# Patient Record
Sex: Female | Born: 2000 | Race: Black or African American | Hispanic: No | Marital: Single | Smoking: Never smoker
Health system: Southern US, Community
[De-identification: ages and names within clinical notes are randomized; demographics above are authoritative.]

## PROBLEM LIST (undated history)

## (undated) DIAGNOSIS — F32A Depression, unspecified: Secondary | ICD-10-CM

## (undated) DIAGNOSIS — J45909 Unspecified asthma, uncomplicated: Secondary | ICD-10-CM

## (undated) HISTORY — DX: Depression, unspecified: F32.A

## (undated) HISTORY — DX: Unspecified asthma, uncomplicated: J45.909

---

## 2020-09-15 ENCOUNTER — Ambulatory Visit (INDEPENDENT_AMBULATORY_CARE_PROVIDER_SITE_OTHER): Payer: Self-pay

## 2020-09-15 ENCOUNTER — Encounter (HOSPITAL_COMMUNITY): Payer: Self-pay

## 2020-09-15 ENCOUNTER — Ambulatory Visit (HOSPITAL_COMMUNITY)
Admission: RE | Admit: 2020-09-15 | Discharge: 2020-09-15 | Disposition: A | Discharge status: Home / Self Care | Payer: Self-pay | Source: Ambulatory Visit | Attending: Family Medicine | Admitting: Family Medicine

## 2020-09-15 ENCOUNTER — Other Ambulatory Visit: Payer: Self-pay

## 2020-09-15 VITALS — BP 129/86 | HR 74 | Temp 98.8°F

## 2020-09-15 DIAGNOSIS — R059 Cough, unspecified: Secondary | ICD-10-CM

## 2020-09-15 DIAGNOSIS — R0602 Shortness of breath: Secondary | ICD-10-CM

## 2020-09-15 DIAGNOSIS — J0100 Acute maxillary sinusitis, unspecified: Secondary | ICD-10-CM

## 2020-09-15 DIAGNOSIS — J209 Acute bronchitis, unspecified: Secondary | ICD-10-CM

## 2020-09-15 MED ORDER — ALBUTEROL SULFATE HFA 108 (90 BASE) MCG/ACT IN AERS
INHALATION_SPRAY | RESPIRATORY_TRACT | Status: AC
  Filled 2020-09-15: qty 6.7

## 2020-09-15 MED ORDER — PREDNISONE 10 MG (21) PO TBPK
ORAL_TABLET | Freq: Every day | ORAL | 0 refills | Status: AC
Start: 2020-09-15 — End: 2020-09-21

## 2020-09-15 MED ORDER — ALBUTEROL SULFATE HFA 108 (90 BASE) MCG/ACT IN AERS
2.0000 | INHALATION_SPRAY | Freq: Once | RESPIRATORY_TRACT | Status: AC
  Administered 2020-09-15: 2 via RESPIRATORY_TRACT

## 2020-09-15 MED ORDER — METHYLPREDNISOLONE SODIUM SUCC 125 MG IJ SOLR
125.0000 mg | Freq: Once | INTRAMUSCULAR | Status: AC
  Administered 2020-09-15: 125 mg via INTRAMUSCULAR

## 2020-09-15 MED ORDER — METHYLPREDNISOLONE SODIUM SUCC 125 MG IJ SOLR
INTRAMUSCULAR | Status: AC
  Filled 2020-09-15: qty 2

## 2020-09-15 MED ORDER — AMOXICILLIN-POT CLAVULANATE 875-125 MG PO TABS
1.0000 | ORAL_TABLET | Freq: Two times a day (BID) | ORAL | 0 refills | Status: AC
Start: 2020-09-15 — End: 2020-09-22

## 2020-09-15 NOTE — ED Provider Notes (Signed)
[facility]   [id number] 09/15/20 Arrival Time: 1147   CC: COVID symptoms  SUBJECTIVE: History from: patient.  Stacy Hayes is a 20 y.o. female who presents with nasal congestion, cough, wheezing, SOB, chest tightness and pain with coughing x 2 weeks . Denies sick exposure to COVID, flu or strep. Denies recent travel. Has negative history of Covid. Has not completed Covid vaccines. Has not taken OTC medications for this. Symptoms are worsened with activity. Denies previous symptoms in the past. Denies fever, chills,  nausea, changes in bowel or bladder habits.    ROS: As per HPI.  All other pertinent ROS negative.     History reviewed. No pertinent past medical history. History reviewed. No pertinent surgical history. No Known Allergies No current facility-administered medications on file prior to encounter.   No current outpatient medications on file prior to encounter.   Social History   Socioeconomic History  . Marital status: Single    Spouse name: Not on file  . Number of children: Not on file  . Years of education: Not on file  . Highest education level: Not on file  Occupational History  . Not on file  Tobacco Use  . Smoking status: Never Smoker  . Smokeless tobacco: Never Used  Substance and Sexual Activity  . Alcohol use: Not on file  . Drug use: Not on file  . Sexual activity: Not on file  Other Topics Concern  . Not on file  Social History Narrative  . Not on file   Social Determinants of Health   Financial Resource Strain: Not on file  Food Insecurity: Not on file  Transportation Needs: Not on file  Physical Activity: Not on file  Stress: Not on file  Social Connections: Not on file  Intimate Partner Violence: Not on file   History reviewed. No pertinent family history.  OBJECTIVE:  Vitals:   09/15/20 1213  BP: 129/86  Pulse: 74  Temp: 98.8 F (37.1 C)  TempSrc: Oral  SpO2: 96%     General appearance: alert; appears  fatigued, but nontoxic; speaking in full sentences and tolerating own secretions HEENT: NCAT; Ears: EACs clear, TMs pearly gray; Eyes: PERRL.  EOM grossly intact. Sinuses: maxillary sinus tenderness; Nose: nares patent with clear rhinorrhea, Throat: oropharynx erythematous, cobblestoning present, tonsils non erythematous or enlarged, uvula midline  Neck: supple with LAD Lungs: unlabored respirations, symmetrical air entry; cough: moderate; no respiratory distress; rhonchi noted to bilateral upper lobes with diffuse wheezing throughout  Heart: regular rate and rhythm.  Radial pulses 2+ symmetrical bilaterally Skin: warm and dry Psychological: alert and cooperative; normal mood and affect  LABS:  No results found for this or any previous visit (from the past 24 hour(s)).   ASSESSMENT & PLAN:  1. Acute non-recurrent maxillary sinusitis   2. Acute bronchitis, unspecified organism   3. SOB (shortness of breath)   4. Cough     Meds ordered this encounter  Medications  . methylPREDNISolone sodium succinate (SOLU-MEDROL) 125 mg/2 mL injection 125 mg  . albuterol (VENTOLIN HFA) 108 (90 Base) MCG/ACT inhaler 2 puff  . amoxicillin-clavulanate (AUGMENTIN) 875-125 MG tablet    Sig: Take 1 tablet by mouth 2 (two) times daily for 7 days.    Dispense:  14 tablet    Refill:  0    Order Specific Question:   Supervising Provider    Answer:   Merrilee Jansky [id number]  . predniSONE (STERAPRED UNI-PAK 21 TAB) 10 MG (21) TBPK tablet  Sig: Take by mouth daily for 6 days. Take 6 tablets on day 1, 5 tablets on day 2, 4 tablets on day 3, 3 tablets on day 4, 2 tablets on day 5, 1 tablet on day 6    Dispense:  21 tablet    Refill:  0    Order Specific Question:   Supervising Provider    Answer:   Merrilee Jansky [[id number]]   Chest xray negative for pneumonia today Solumedrol 125mg  IM in office today Albuterol inhaler 2 puffs in office today Prescribed steroid taper Prescribed Augmentin BID x 7 days  for likely sinusitis Continue supportive care at home Get plenty of rest and push fluids Use OTC zyrtec for nasal congestion, runny nose, and/or sore throat Use OTC flonase for nasal congestion and runny nose Use medications daily for symptom relief Use OTC medications like ibuprofen or tylenol as needed fever or pain Call or go to the ED if you have any new or worsening symptoms such as fever, worsening cough, shortness of breath, chest tightness, chest pain, turning blue, changes in mental status.  Reviewed expectations re: course of current medical issues. Questions answered. Outlined signs and symptoms indicating need for more acute intervention. Patient verbalized understanding. After Visit Summary given.         , NP 09/15/20 1257

## 2020-09-15 NOTE — Discharge Instructions (Addendum)
Your chest xray is negative for pneumonia  You have received a steroid injection to help open you up and breathe a little better  You have received an albuterol inhaler in the office today. You may use 2 puffs every 4 hours as needed for cough, shortness of breath, wheezing.  I have sent in Augmentin for you to take twice a day for 7 days.  I have sent in a prednisone taper for you to take for 6 days. 6 tablets on day one, 5 tablets on day two, 4 tablets on day three, 3 tablets on day four, 2 tablets on day five, and 1 tablet on day six.  Follow up with this office or with primary care if symptoms are persisting.  Follow up in the ER for high fever, trouble swallowing, trouble breathing, other concerning symptoms.

## 2020-09-15 NOTE — ED Triage Notes (Signed)
Pt presents with SOB x 2 weeks. Pt states she has been noticing blood in her mucus.

## 2020-10-17 ENCOUNTER — Encounter (HOSPITAL_COMMUNITY): Payer: Self-pay

## 2020-10-17 ENCOUNTER — Ambulatory Visit (HOSPITAL_COMMUNITY)
Admission: RE | Admit: 2020-10-17 | Discharge: 2020-10-17 | Disposition: A | Discharge status: Home / Self Care | Payer: Self-pay | Source: Ambulatory Visit | Attending: Urgent Care | Admitting: Urgent Care

## 2020-10-17 ENCOUNTER — Other Ambulatory Visit: Payer: Self-pay

## 2020-10-17 VITALS — BP 138/90 | HR 78 | Temp 100.1°F | Resp 17

## 2020-10-17 DIAGNOSIS — R590 Localized enlarged lymph nodes: Secondary | ICD-10-CM | POA: Insufficient documentation

## 2020-10-17 DIAGNOSIS — L739 Follicular disorder, unspecified: Secondary | ICD-10-CM | POA: Insufficient documentation

## 2020-10-17 LAB — COMPREHENSIVE METABOLIC PANEL
ALT: 15 U/L (ref 0–44)
AST: 21 U/L (ref 15–41)
Albumin: 4.4 g/dL (ref 3.5–5.0)
Alkaline Phosphatase: 53 U/L (ref 38–126)
Anion gap: 8 (ref 5–15)
BUN: 5 mg/dL — ABNORMAL LOW (ref 6–20)
CO2: 25 mmol/L (ref 22–32)
Calcium: 9.8 mg/dL (ref 8.9–10.3)
Chloride: 105 mmol/L (ref 98–111)
Creatinine, Ser: 0.92 mg/dL (ref 0.44–1.00)
GFR, Estimated: >60 mL/min (ref >60)
Glucose, Bld: 87 mg/dL (ref 70–99)
Potassium: 3.9 mmol/L (ref 3.5–5.1)
Sodium: 138 mmol/L (ref 135–145)
Total Bilirubin: 0.8 mg/dL (ref 0.3–1.2)
Total Protein: 7.7 g/dL (ref 6.5–8.1)

## 2020-10-17 LAB — CBC WITH DIFFERENTIAL/PLATELET
Abs Immature Granulocytes: 0.01 10*3/uL (ref 0.00–0.07)
Basophils Absolute: 0 10*3/uL (ref 0.0–0.1)
Basophils Relative: 1 %
Eosinophils Absolute: 0.4 10*3/uL (ref 0.0–0.5)
Eosinophils Relative: 7 %
HCT: 46.6 % — ABNORMAL HIGH (ref 36.0–46.0)
Hemoglobin: 15.6 g/dL — ABNORMAL HIGH (ref 12.0–15.0)
Immature Granulocytes: 0 %
Lymphocytes Relative: 31 %
Lymphs Abs: 1.5 10*3/uL (ref 0.7–4.0)
MCH: 30.7 pg (ref 26.0–34.0)
MCHC: 33.5 g/dL (ref 30.0–36.0)
MCV: 91.7 fL (ref 80.0–100.0)
Monocytes Absolute: 0.4 10*3/uL (ref 0.1–1.0)
Monocytes Relative: 9 %
Neutro Abs: 2.6 10*3/uL (ref 1.7–7.7)
Neutrophils Relative %: 52 %
Platelets: 365 10*3/uL (ref 150–400)
RBC: 5.08 MIL/uL (ref 3.87–5.11)
RDW: 11.9 % (ref 11.5–15.5)
WBC: 5 10*3/uL (ref 4.0–10.5)
nRBC: 0 % (ref 0.0–0.2)

## 2020-10-17 LAB — HIV ANTIBODY (ROUTINE TESTING W REFLEX): HIV Screen 4th Generation wRfx: NONREACTIVE

## 2020-10-17 MED ORDER — NAPROXEN 500 MG PO TABS: 500.0000 mg | ORAL_TABLET | Freq: Two times a day (BID) | ORAL | 0 refills | Status: DC

## 2020-10-17 MED ORDER — DOXYCYCLINE HYCLATE 100 MG PO CAPS: 100.0000 mg | ORAL_CAPSULE | Freq: Two times a day (BID) | ORAL | 0 refills | Status: DC

## 2020-10-17 NOTE — ED Triage Notes (Signed)
Pt presents with unknown sores in scalp that is causing swelling behind her ear for past few days; pt states she had steriods and antibiotics at last visit and did not finish them.

## 2020-10-17 NOTE — ED Provider Notes (Signed)
[facility]   MRN: [id number] DOB: 2000-12-16  Subjective:   Stacy Hayes is a 20 y.o. female presenting for 3 to 4 days of acute onset painful sores over her scalp area.  Has also started to notice that she has had more swollen lymph nodes along the sides of her neck.  States that on the left side she is persistently had this since she was a child but has never had it worked up.  Denies chest pain, shortness of breath, nausea, vomiting, abdominal pain.  To the best of her knowledge, patient is otherwise healthy.  No current facility-administered medications for this encounter. No current outpatient medications on file.   No Known Allergies  History reviewed. No pertinent past medical history.   History reviewed. No pertinent surgical history.  Family History  Family history unknown: Yes    Social History   Tobacco Use  . Smoking status: Never Smoker  . Smokeless tobacco: Never Used    ROS   Objective:   Vitals: BP 138/90 (BP Location: Left Arm)   Pulse 78   Temp 100.1 F (37.8 C) (Oral)   Resp 17   LMP 09/25/2020   SpO2 98%   Physical Exam Constitutional:      General: She is not in acute distress.    Appearance: Normal appearance. She is well-developed. She is obese. She is not ill-appearing, toxic-appearing or diaphoretic.  HENT:     Head: Normocephalic and atraumatic.      Right Ear: External ear normal.     Left Ear: External ear normal.     Nose: Nose normal.     Mouth/Throat:     Mouth: Mucous membranes are moist.     Pharynx: Oropharynx is clear.  Eyes:     General: No scleral icterus.       Right eye: No discharge.        Left eye: No discharge.     Extraocular Movements: Extraocular movements intact.     Conjunctiva/sclera: Conjunctivae normal.     Pupils: Pupils are equal, round, and reactive to light.  Cardiovascular:     Rate and Rhythm: Normal rate.  Pulmonary:     Effort: Pulmonary effort is normal.  Chest:   Breasts:     Right: No axillary adenopathy or supraclavicular adenopathy.     Left: No axillary adenopathy or supraclavicular adenopathy.    Lymphadenopathy:     Head:     Right side of head: No submental, submandibular, tonsillar, preauricular, posterior auricular or occipital adenopathy.     Left side of head: No submental, submandibular, tonsillar, preauricular, posterior auricular or occipital adenopathy.     Cervical: Cervical adenopathy present.     Right cervical: Superficial cervical adenopathy present.     Left cervical: Superficial cervical adenopathy present.     Upper Body:     Right upper body: No supraclavicular, axillary, pectoral or epitrochlear adenopathy.     Left upper body: No supraclavicular, axillary, pectoral or epitrochlear adenopathy.     Lower Body: No right inguinal adenopathy. No left inguinal adenopathy.  Skin:    General: Skin is warm and dry.  Neurological:     General: No focal deficit present.     Mental Status: She is alert and oriented to person, place, and time.  Psychiatric:        Mood and Affect: Mood normal.        Behavior: Behavior normal.  Thought Content: Thought content normal.        Judgment: Judgment normal.     Assessment and Plan :   PDMP not reviewed this encounter.  1. Folliculitis   2. Cervical lymphadenopathy     Will manage for folliculitis with doxycycline especially in light of her pain, fever.  Use naproxen for pain, inflammation and cervical lymphadenopathy, labs pending.  Emphasized need to establish care with a PCP for close follow-up. Counseled patient on potential for adverse effects with medications prescribed/recommended today, ER and return-to-clinic precautions discussed, patient verbalized understanding.    Wallis Bamberg, PA-C 10/17/20 1552

## 2020-10-18 LAB — RPR: RPR Ser Ql: NONREACTIVE

## 2020-10-26 ENCOUNTER — Encounter: Payer: Self-pay | Admitting: *Deleted

## 2020-10-30 ENCOUNTER — Telehealth: Payer: Self-pay

## 2020-10-30 NOTE — Telephone Encounter (Signed)
Left message to call back and set new patient appt

## 2020-12-02 ENCOUNTER — Ambulatory Visit: Payer: Self-pay | Admitting: *Deleted

## 2020-12-02 NOTE — Telephone Encounter (Signed)
  Patient c/o multiple areas on body with swollen lymph nodes. Patient went to UC due to swollen lymph nodes , sore throat, fatigue and lymph nodes getting bigger over time. Noted swelling of lymph nodes greater than 10 days ago . Reports slow growing. Behind neck - marble size, behind left ear- pea size, under right and left armpits. Right greater than left , no pain but discomfort, denies redness . Noted swelling behind left ear in April 16 . C/o fever temp 100 range. Sore throat,and noted tonsil look swollen. Mild shortness of breath at times. Denies chest pain , difficulty breathing or swallowing. No pulsating from swollen areas. Patient called to establish with PCP per UC request. Care advise given. Patient verbalized understanding if symptoms worsen call back or go to [facility] or ED.   Reason for Disposition . [1] Mildly swollen lymph node AND [2] has cold symptoms (e.g., runny nose, cough, sore throat)  Answer Assessment - Initial Assessment Questions 1. LOCATION: "Where is the swollen node located?" "Is the matching node on the other side of the body also swollen?"      Multiple areas. Behind neck , behind left ear, under right and left armpits.  2. SIZE: "How big is the node?" (e.g., inches or centimeters; or compared to common objects such as pea, bean, marble, golf ball)      Neck - marble size, behind left ear pea size 3. ONSET: "When did the swelling start?"      Greater than 10 days ago. Slow growing  4. NECK NODES: "Is there a sore throat, runny nose or other symptoms of a cold?"      Sore throat, tonsils swollen 5. GROIN OR ARMPIT NODES: "Is there a sore, scratch, cut or painful red area on that arm or leg?"      No pain, discomfort no redness.  6. FEVER: "Do you have a fever?" If Yes, ask: "What is it, how was it measured, and when did it start?"      100 7. CAUSE: "What do you think is causing the swollen lymph nodes?"     Not sure  8. OTHER SYMPTOMS: "Do you have any other  symptoms?"     Fatigue with mild shortness of breath 9. PREGNANCY: "Is there any chance you are pregnant?" "When was your last menstrual period?"     na  Protocols used: LYMPH NODES - [facility]

## 2020-12-07 ENCOUNTER — Ambulatory Visit: Payer: Self-pay | Admitting: Family Medicine

## 2020-12-09 ENCOUNTER — Ambulatory Visit: Payer: Self-pay | Admitting: Family Medicine

## 2021-01-13 ENCOUNTER — Ambulatory Visit
Admission: RE | Admit: 2021-01-13 | Discharge: 2021-01-13 | Disposition: A | Discharge status: Home / Self Care | Payer: Self-pay | Source: Ambulatory Visit | Attending: Emergency Medicine | Admitting: Emergency Medicine

## 2021-01-13 ENCOUNTER — Other Ambulatory Visit: Payer: Self-pay

## 2021-01-13 VITALS — BP 124/78 | HR 94 | Temp 98.3°F | Resp 18

## 2021-01-13 DIAGNOSIS — J039 Acute tonsillitis, unspecified: Secondary | ICD-10-CM

## 2021-01-13 DIAGNOSIS — R1310 Dysphagia, unspecified: Secondary | ICD-10-CM

## 2021-01-13 DIAGNOSIS — J020 Streptococcal pharyngitis: Secondary | ICD-10-CM

## 2021-01-13 DIAGNOSIS — R591 Generalized enlarged lymph nodes: Secondary | ICD-10-CM

## 2021-01-13 LAB — POCT RAPID STREP A (OFFICE): Rapid Strep A Screen: POSITIVE — AB

## 2021-01-13 MED ORDER — IBUPROFEN 800 MG PO TABS: 800.0000 mg | ORAL_TABLET | Freq: Three times a day (TID) | ORAL | 0 refills | Status: DC

## 2021-01-13 MED ORDER — LIDOCAINE VISCOUS HCL 2 % MT SOLN: 15.0000 mL | OROMUCOSAL | 0 refills | Status: DC | PRN

## 2021-01-13 MED ORDER — AMOXICILLIN 500 MG PO CAPS: 500.0000 mg | ORAL_CAPSULE | Freq: Two times a day (BID) | ORAL | 0 refills | Status: DC

## 2021-01-13 MED ORDER — FAMOTIDINE 20 MG PO TABS: 20.0000 mg | ORAL_TABLET | Freq: Two times a day (BID) | ORAL | 0 refills | Status: DC

## 2021-01-13 NOTE — ED Provider Notes (Signed)
[facility]    CSN: [id number] Arrival date & time: 01/13/21  1154      History   Chief Complaint Chief Complaint  Patient presents with   Neck Pain    HPI Zaire Hamlett is a 20 y.o. female presenting today for evaluation of multiple symptoms-swollen lymph nodes, folliculitis, sore throat and rash.  Reports that over the past 3 months she has noticed prominent nodes to her neck and head.  Reports recently had become more painful and swollen.  She also expresses concern over possible thyroid nodules which has been felt by somebody in the past.  She has never had her thyroid evaluated.  She reports in the past few days she has developed increased sore throat and swelling in throat as well as sensation of lump lower in her throat with swallowing as well as increased discomfort in this area with deep inspiration.  Has felt subjective fevers.  HPI  History reviewed. No pertinent past medical history.  There are no problems to display for this patient.   History reviewed. No pertinent surgical history.  OB History   No obstetric history on file.      Home Medications    Prior to Admission medications   Medication Sig Start Date End Date Taking? Authorizing Provider  amoxicillin (AMOXIL) 500 MG capsule Take 1 capsule (500 mg total) by mouth 2 (two) times daily for 10 days. 01/13/21 01/23/21 Yes Yanett Conkright C, PA-C  famotidine (PEPCID) 20 MG tablet Take 1 tablet (20 mg total) by mouth 2 (two) times daily. 01/13/21  Yes Hazle Ogburn C, PA-C  ibuprofen (ADVIL) 800 MG tablet Take 1 tablet (800 mg total) by mouth 3 (three) times daily. 01/13/21  Yes Favor Kreh C, PA-C  lidocaine (XYLOCAINE) 2 % solution Use as directed 15 mLs in the mouth or throat every 4 (four) hours as needed for mouth pain. 01/13/21  Yes Mikeisha Lemonds, Junius Creamer, PA-C    Family History Family History  Problem Relation Age of Onset   Hypothyroidism Mother     Social History Social History    Tobacco Use   Smoking status: Never   Smokeless tobacco: Never  Substance Use Topics   Alcohol use: Yes    Comment: socially   Drug use: Never     Allergies   Patient has no known allergies.   Review of Systems Review of Systems  Constitutional:  Positive for fever. Negative for activity change, appetite change, chills and fatigue.  HENT:  Positive for sore throat. Negative for congestion, ear pain, rhinorrhea, sinus pressure and trouble swallowing.   Eyes:  Negative for discharge and redness.  Respiratory:  Negative for cough, chest tightness and shortness of breath.   Cardiovascular:  Negative for chest pain.  Gastrointestinal:  Negative for abdominal pain, diarrhea, nausea and vomiting.  Musculoskeletal:  Negative for myalgias.  Skin:  Positive for rash.  Neurological:  Negative for dizziness, light-headedness and headaches.  Hematological:  Positive for adenopathy.    Physical Exam Triage Vital Signs ED Triage Vitals  Enc Vitals Group     BP      Pulse      Resp      Temp      Temp src      SpO2      Weight      Height      Head Circumference      Peak Flow      Pain Score  Pain Loc      Pain Edu?      Excl. in GC?    No data found.  Updated Vital Signs BP 124/78 (BP Location: Left Arm)   Pulse 94   Temp 98.3 F (36.8 C) (Oral)   Resp 18   LMP 12/25/2020   SpO2 97%   Visual Acuity Right Eye Distance:   Left Eye Distance:   Bilateral Distance:    Right Eye Near:   Left Eye Near:    Bilateral Near:     Physical Exam Vitals and nursing note reviewed.  Constitutional:      Appearance: She is well-developed.     Comments: No acute distress  HENT:     Head: Normocephalic and atraumatic.     Ears:     Comments: Bilateral ears without tenderness to palpation of external auricle, tragus and mastoid, EAC's without erythema or swelling, TM's with good bony landmarks and cone of light. Non erythematous.      Nose: Nose normal.      Mouth/Throat:     Comments: Bilateral tonsils swollen erythematous with mild exudate bilaterally, no soft palate swelling Eyes:     Conjunctiva/sclera: Conjunctivae normal.  Cardiovascular:     Rate and Rhythm: Normal rate.  Pulmonary:     Effort: Pulmonary effort is normal. No respiratory distress.     Comments: Breathing comfortably at rest, CTABL, no wheezing, rales or other adventitious sounds auscultated  Abdominal:     General: There is no distension.  Musculoskeletal:        General: Normal range of motion.     Cervical back: Neck supple.  Skin:    General: Skin is warm and dry.  Neurological:     Mental Status: She is alert and oriented to person, place, and time.     UC Treatments / Results  Labs (all labs ordered are listed, but only abnormal results are displayed) Labs Reviewed  POCT RAPID STREP A (OFFICE) - Abnormal; Notable for the following components:      Result Value   Rapid Strep A Screen Positive (*)    All other components within normal limits  CBC WITH DIFFERENTIAL/PLATELET  BASIC METABOLIC PANEL  TSH  EBV AB TO VIRAL CAPSID AG PNL, IGG+IGM    EKG   Radiology No results found.  Procedures Procedures (including critical care time)  Medications Ordered in UC Medications - No data to display  Initial Impression / Assessment and Plan / UC Course  I have reviewed the triage vital signs and the nursing notes.  Pertinent labs & imaging results that were available during my care of the patient were reviewed by me and considered in my medical decision making (see chart for details).     Strep test positive-treating for strep with amoxicillin x10 days, symptomatic and supportive care as well, may be contributing to increased lymphadenopathy recently. Lymphadenopathy-symptoms x3 months, screening for mono, blood work pending as well as CBC and BMP, encouraged follow-up with primary care for further evaluation Thyroid nodules-TSH pending, encouraged  follow-up with primary care as well for further evaluation of thyroid. Dysphagia-sensation of lump in throat, possible correlation underlying acid and recommended Pepcid, also recommended further follow-up with primary care if symptoms persisting despite treatment with antacids.  Discussed strict return precautions. Patient verbalized understanding and is agreeable with plan.  Final Clinical Impressions(s) / UC Diagnoses   Final diagnoses:  Lymphadenopathy  Acute tonsillitis, unspecified etiology  Dysphagia, unspecified type  Streptococcal sore throat  Discharge Instructions      Strep test positive-begin amoxicillin twice daily for 10 days, please complete full course Tylenol and ibuprofen for pain and swelling Salt water gargles May use viscous lidocaine up to 3-4 times daily for discomfort Trial of Pepcid twice daily to help with sensation of difficulty swallowing Blood work pending to further evaluate thyroid, screen for mono and check basic blood counts, I will call if abnormal Please contact primary care's to set up follow-up appointment for further evaluation of your thyroid/symptoms.     ED Prescriptions     Medication Sig Dispense Auth. Provider   amoxicillin (AMOXIL) 500 MG capsule Take 1 capsule (500 mg total) by mouth 2 (two) times daily for 10 days. 20 capsule Calven Gilkes C, PA-C   ibuprofen (ADVIL) 800 MG tablet Take 1 tablet (800 mg total) by mouth 3 (three) times daily. 21 tablet Zarin Hagmann C, PA-C   famotidine (PEPCID) 20 MG tablet Take 1 tablet (20 mg total) by mouth 2 (two) times daily. 30 tablet Nakyia Dau C, PA-C   lidocaine (XYLOCAINE) 2 % solution Use as directed 15 mLs in the mouth or throat every 4 (four) hours as needed for mouth pain. 200 mL Gauge Winski, [place] C, PA-C      PDMP not reviewed this encounter.   Lew Dawes, PA-C 01/13/21 1408

## 2021-01-13 NOTE — Discharge Instructions (Addendum)
Strep test positive-begin amoxicillin twice daily for 10 days, please complete full course Tylenol and ibuprofen for pain and swelling Salt water gargles May use viscous lidocaine up to 3-4 times daily for discomfort Trial of Pepcid twice daily to help with sensation of difficulty swallowing Blood work pending to further evaluate thyroid, screen for mono and check basic blood counts, I will call if abnormal Please contact primary care's to set up follow-up appointment for further evaluation of your thyroid/symptoms.

## 2021-01-13 NOTE — ED Triage Notes (Signed)
Patient presents to [facility] for evaluation of neck pain, lymph swelling, "thryoid nodules".  PAtient states she has had some voice hoarseness intermittently for the past few months as well.  Starting 3 days ago she began to have sore throat and pain with swallowing.  Also concerned for folliculitis of her scalp, treated with doxycycline in April of 2022

## 2021-01-14 ENCOUNTER — Other Ambulatory Visit: Payer: Self-pay

## 2021-01-14 ENCOUNTER — Emergency Department (HOSPITAL_BASED_OUTPATIENT_CLINIC_OR_DEPARTMENT_OTHER)
Admission: EM | Admit: 2021-01-14 | Discharge: 2021-01-14 | Disposition: A | Discharge status: Home / Self Care | Payer: Self-pay | Attending: Emergency Medicine | Admitting: Emergency Medicine

## 2021-01-14 ENCOUNTER — Encounter (HOSPITAL_BASED_OUTPATIENT_CLINIC_OR_DEPARTMENT_OTHER): Payer: Self-pay | Admitting: *Deleted

## 2021-01-14 ENCOUNTER — Ambulatory Visit: Payer: Self-pay | Admitting: *Deleted

## 2021-01-14 DIAGNOSIS — J020 Streptococcal pharyngitis: Secondary | ICD-10-CM | POA: Insufficient documentation

## 2021-01-14 MED ORDER — ONDANSETRON 4 MG PO TBDP: 4.0000 mg | ORAL_TABLET | Freq: Three times a day (TID) | ORAL | 0 refills | Status: DC | PRN

## 2021-01-14 MED ORDER — AMOXICILLIN 400 MG/5ML PO SUSR: 500.0000 mg | Freq: Two times a day (BID) | ORAL | 0 refills | Status: AC

## 2021-01-14 NOTE — Telephone Encounter (Signed)
Patient reports she has had swollen lymph nodes and thyroid enlargement. Not able to eat and drink for 3 days- painful swallowing. Patient was seen at [facility]- yesterday- patient was treated for antiacid, pain relief, antibiotic. Reason for Disposition  SEVERE symptoms of pill stuck in throat or esophagus (e.g., severe pain, bleeding, or inability to swallow liquids)  Answer Assessment - Initial Assessment Questions 1. SYMPTOM: "Are you having difficulty swallowing liquids, solids, or both?"     Difficulty swallowing 2. ONSET: "When did the swallowing problems begin?"      March - getting worse 3. CAUSE: "What do you think is causing the problem?"      Thyroid nodules getting larger 4. CHRONIC/RECURRENT: "Is this a new problem for you?"  If no, ask: "How long have you had this problem?" (e.g., days, weeks, months)      Worsening symptoms 5. OTHER SYMPTOMS: "Do you have any other symptoms?" (e.g., difficulty breathing, sore throat, swollen tongue, chest pain)     Painful swallowing  Protocols used: Swallowing Difficulty-A-AH

## 2021-01-14 NOTE — ED Triage Notes (Signed)
Pt reports + for strep yesterday at Urgent Care. Unable to take antibiotics due to pain with swallowing. Also reports hx of thyroid nodules

## 2021-01-14 NOTE — ED Provider Notes (Signed)
[facility] EMERGENCY DEPT Provider Note   CSN: [id number] Arrival date & time: 01/14/21  1231     History Chief Complaint  Patient presents with   Sore Throat    Stacy Hayes is a 20 y.o. female.  HPI 20 year old female presents with sore throat and difficulty swallowing.  She has been having some issues for several months and has been told by her nurse practitioner friend that she has thyroid nodules.  She is also had some swollen lymph nodes.  However over the last 3 days she is now having pain when swallowing and breathing in her throat.  No fevers.  She is having some new lymph node swelling.  Went to urgent care yesterday where she tested positive for strep and was given a prescription of amoxicillin, Pepcid, ibuprofen and lidocaine solution.  She has not taken any of these.  She did take 2 doses of amoxicillin that she had leftover when this first started but it did not seem to help so she was discouraged by this.  Sometimes when she swallows she will vomit back up.  Otherwise liquids and food are able to be swallowed, she is very painful.  History reviewed. No pertinent past medical history.  There are no problems to display for this patient.   History reviewed. No pertinent surgical history.   OB History   No obstetric history on file.     Family History  Problem Relation Age of Onset   Hypothyroidism Mother     Social History   Tobacco Use   Smoking status: Never   Smokeless tobacco: Never  Vaping Use   Vaping Use: Every day  Substance Use Topics   Alcohol use: Yes    Comment: socially   Drug use: Never    Home Medications Prior to Admission medications   Medication Sig Start Date End Date Taking? Authorizing Provider  amoxicillin (AMOXIL) 400 MG/5ML suspension Take 6.3 mLs (500 mg total) by mouth 2 (two) times daily for 10 days. 01/14/21 01/24/21 Yes Pricilla Loveless, MD  ondansetron (ZOFRAN ODT) 4 MG disintegrating tablet Take 1 tablet  (4 mg total) by mouth every 8 (eight) hours as needed for nausea or vomiting. 01/14/21  Yes Pricilla Loveless, MD  famotidine (PEPCID) 20 MG tablet Take 1 tablet (20 mg total) by mouth 2 (two) times daily. 01/13/21   Wieters, Hallie C, PA-C  ibuprofen (ADVIL) 800 MG tablet Take 1 tablet (800 mg total) by mouth 3 (three) times daily. 01/13/21   Wieters, Hallie C, PA-C  lidocaine (XYLOCAINE) 2 % solution Use as directed 15 mLs in the mouth or throat every 4 (four) hours as needed for mouth pain. 01/13/21   Wieters, Hallie C, PA-C    Allergies    Patient has no known allergies.  Review of Systems   Review of Systems  Constitutional:  Negative for fever.  HENT:  Positive for sore throat and trouble swallowing. Negative for voice change.   Respiratory:  Negative for cough and shortness of breath.   Gastrointestinal:  Positive for vomiting.   Physical Exam Updated Vital Signs BP 126/85 (BP Location: Left Arm)   Pulse 100   Temp 98.2 F (36.8 C) (Oral)   Resp 16   Ht 5\' 6"  (1.676 m)   Wt 94 kg   LMP 12/25/2020   SpO2 97%   BMI 33.45 kg/m   Physical Exam Vitals and nursing note reviewed.  Constitutional:      General: She is not in acute  distress.    Appearance: She is well-developed. She is not ill-appearing or diaphoretic.  HENT:     Head: Normocephalic and atraumatic.     Right Ear: External ear normal.     Left Ear: External ear normal.     Nose: Nose normal.     Mouth/Throat:     Pharynx: Uvula midline. Posterior oropharyngeal erythema present. No uvula swelling.     Tonsils: Tonsillar exudate present. No tonsillar abscesses. 2+ on the right. 2+ on the left.     Comments: Normal phonation Eyes:     General:        Right eye: No discharge.        Left eye: No discharge.  Cardiovascular:     Rate and Rhythm: Normal rate and regular rhythm.     Heart sounds: Normal heart sounds.  Pulmonary:     Effort: Pulmonary effort is normal.     Breath sounds: Normal breath sounds.   Abdominal:     Palpations: Abdomen is soft.     Tenderness: There is no abdominal tenderness.  Skin:    General: Skin is warm and dry.  Neurological:     Mental Status: She is alert.  Psychiatric:        Mood and Affect: Mood is not anxious.    ED Results / Procedures / Treatments   Labs (all labs ordered are listed, but only abnormal results are displayed) Labs Reviewed - No data to display  EKG None  Radiology No results found.  Procedures Procedures   Medications Ordered in ED Medications - No data to display  ED Course  I have reviewed the triage vital signs and the nursing notes.  Pertinent labs & imaging results that were available during my care of the patient were reviewed by me and considered in my medical decision making (see chart for details).    MDM Rules/Calculators/A&P                          Patient appears to have strep pharyngitis but no obvious abscess.  Her tonsils are about 2+.  Unfortunately, she has not started many of the medicines were prescribed including the Xylocaine which should help.  I will change her amoxicillin to liquid and give her some Zofran but a lot of the things she is hoping I can evaluate today such as a thyroid ultrasound is just not available or practical in the emergency department.  We will treat her current strep and recommend she follow-up with her PCP.  Given return precautions. Final Clinical Impression(s) / ED Diagnoses Final diagnoses:  Strep pharyngitis    Rx / DC Orders ED Discharge Orders          Ordered    amoxicillin (AMOXIL) 400 MG/5ML suspension  2 times daily        01/14/21 1613    ondansetron (ZOFRAN ODT) 4 MG disintegrating tablet  Every 8 hours PRN        01/14/21 1613             Pricilla Loveless, MD 01/14/21 1616

## 2021-01-14 NOTE — Discharge Instructions (Addendum)
If you develop inability to swallow, not just due to pain, vomiting, trouble breathing, high fever, or any other new/concerning symptoms then return to the ER for evaluation.  It is important to follow-up with your primary care physician for your thyroid follow-up as well as your lymph nodes.

## 2021-01-15 ENCOUNTER — Telehealth: Payer: Self-pay | Admitting: Emergency Medicine

## 2021-01-15 LAB — BASIC METABOLIC PANEL
BUN/Creatinine Ratio: 14 (ref 9–23)
BUN: 11 mg/dL (ref 6–20)
CO2: 20 mmol/L (ref 20–29)
Calcium: 9.3 mg/dL (ref 8.7–10.2)
Chloride: 103 mmol/L (ref 96–106)
Creatinine, Ser: 0.81 mg/dL (ref 0.57–1.00)
Glucose: 82 mg/dL (ref 65–99)
Potassium: 4.6 mmol/L (ref 3.5–5.2)
Sodium: 138 mmol/L (ref 134–144)
eGFR: 106 mL/min/{1.73_m2} (ref >59)

## 2021-01-15 LAB — CBC WITH DIFFERENTIAL/PLATELET
Basophils Absolute: 0 10*3/uL (ref 0.0–0.2)
Basos: 1 %
EOS (ABSOLUTE): 0 10*3/uL (ref 0.0–0.4)
Eos: 0 %
Hematocrit: 42.9 % (ref 34.0–46.6)
Hemoglobin: 14.2 g/dL (ref 11.1–15.9)
Immature Grans (Abs): 0 10*3/uL (ref 0.0–0.1)
Immature Granulocytes: 0 %
Lymphocytes Absolute: 1.1 10*3/uL (ref 0.7–3.1)
Lymphs: 25 %
MCH: 30.1 pg (ref 26.6–33.0)
MCHC: 33.1 g/dL (ref 31.5–35.7)
MCV: 91 fL (ref 79–97)
Monocytes Absolute: 0.5 10*3/uL (ref 0.1–0.9)
Monocytes: 12 %
Neutrophils Absolute: 2.6 10*3/uL (ref 1.4–7.0)
Neutrophils: 62 %
Platelets: 271 10*3/uL (ref 150–450)
RBC: 4.71 x10E6/uL (ref 3.77–5.28)
RDW: 11.6 % — ABNORMAL LOW (ref 11.7–15.4)
WBC: 4.3 10*3/uL (ref 3.4–10.8)

## 2021-01-15 LAB — EBV AB TO VIRAL CAPSID AG PNL, IGG+IGM
EBV VCA IgG: 109 U/mL — ABNORMAL HIGH (ref 0.0–17.9)
EBV VCA IgM: 97.5 U/mL — ABNORMAL HIGH (ref 0.0–35.9)

## 2021-01-15 LAB — TSH: TSH: 1.74 u[IU]/mL (ref 0.450–4.500)

## 2021-01-15 MED ORDER — OMEPRAZOLE 20 MG PO CPDR: 20.0000 mg | DELAYED_RELEASE_CAPSULE | Freq: Every day | ORAL | 0 refills | Status: DC

## 2021-01-15 MED ORDER — SUCRALFATE 1 G PO TABS: 1.0000 g | ORAL_TABLET | Freq: Three times a day (TID) | ORAL | 0 refills | Status: DC

## 2021-01-15 NOTE — Telephone Encounter (Signed)
Patient called to inform of positive mono, discussed recommendations, signs to monitor for, reports continuing to have a lot of discomfort in her epigastrium, she reports concern for ulcer, will send Prilosec and Carafate to use.  Discussed avoiding trauma to abdomen/risk of spleen rupture.  Patient to follow-up if abdominal pain not improving or worsening.  Patient verbalized understanding, questions answered.

## 2021-01-19 ENCOUNTER — Ambulatory Visit (HOSPITAL_BASED_OUTPATIENT_CLINIC_OR_DEPARTMENT_OTHER): Payer: Self-pay | Admitting: Nurse Practitioner

## 2021-01-19 NOTE — Progress Notes (Deleted)
  Shawna Clamp, DNP, AGNP-c Primary Care Services ______________________________________________________________________  HPI Stacy Hayes is a 20 y.o. female presenting to [facility] today to establish care.   Patient Care Team: Pcp, No as PCP - General  Health Maintenance  Topic Date Due   COVID-19 Vaccine (1) Never done   HPV Vaccine (1 - 2-dose series) Never done   Hepatitis C Screening: USPSTF Recommendation to screen - Ages 47-79 yo.  Never done   Tetanus Vaccine  Never done   Pap Smear  Never done   Pap Smear  Never done   Flu Shot  02/01/2021   HIV Screening  Completed   Pneumococcal Vaccination  Aged Out     Concerns today: ***   There are no problems to display for this patient.   PHQ9 Today: No flowsheet data found. GAD7 Today: No flowsheet data found. ______________________________________________________________________ PMH No past medical history on file.  ROS All review of systems negative except what is listed in the HPI  PHYSICAL EXAM Physical Exam ______________________________________________________________________ ASSESSMENT AND PLAN Problem List Items Addressed This Visit   None   Education provided today during visit and on AVS for patient to review at home.  Diet and Exercise recommendations provided.  Current diagnoses and recommendations discussed. HM recommendations reviewed with recommendations.    Outpatient Encounter Medications as of 01/19/2021  Medication Sig   amoxicillin (AMOXIL) 400 MG/5ML suspension Take 6.3 mLs (500 mg total) by mouth 2 (two) times daily for 10 days.   famotidine (PEPCID) 20 MG tablet Take 1 tablet (20 mg total) by mouth 2 (two) times daily.   ibuprofen (ADVIL) 800 MG tablet Take 1 tablet (800 mg total) by mouth 3 (three) times daily.   lidocaine (XYLOCAINE) 2 % solution Use as directed 15 mLs in the mouth or throat every 4 (four) hours as  needed for mouth pain.   omeprazole (PRILOSEC) 20 MG capsule Take 1 capsule (20 mg total) by mouth daily.   ondansetron (ZOFRAN ODT) 4 MG disintegrating tablet Take 1 tablet (4 mg total) by mouth every 8 (eight) hours as needed for nausea or vomiting.   sucralfate (CARAFATE) 1 g tablet Take 1 tablet (1 g total) by mouth 4 (four) times daily -  with meals and at bedtime.   No facility-administered encounter medications on file as of 01/19/2021.    No follow-ups on file.  Time: ***minutes, >50% spent counseling, care coordination, chart review, and documentation.   Tollie Eth, DNP, AGNP-c

## 2021-01-25 ENCOUNTER — Encounter (HOSPITAL_BASED_OUTPATIENT_CLINIC_OR_DEPARTMENT_OTHER): Payer: Self-pay | Admitting: Nurse Practitioner

## 2021-01-28 ENCOUNTER — Other Ambulatory Visit: Payer: Self-pay | Admitting: Family Medicine

## 2021-01-28 DIAGNOSIS — R590 Localized enlarged lymph nodes: Secondary | ICD-10-CM

## 2021-02-01 ENCOUNTER — Telehealth (HOSPITAL_BASED_OUTPATIENT_CLINIC_OR_DEPARTMENT_OTHER): Payer: Self-pay

## 2021-02-01 NOTE — Telephone Encounter (Signed)
Called to resched NP appt - Pt not Interested

## 2021-03-25 ENCOUNTER — Encounter: Payer: Self-pay | Admitting: Physician Assistant

## 2021-03-25 ENCOUNTER — Telehealth: Payer: Self-pay | Admitting: Physician Assistant

## 2021-03-25 DIAGNOSIS — L739 Follicular disorder, unspecified: Secondary | ICD-10-CM

## 2021-03-25 MED ORDER — DOXYCYCLINE HYCLATE 100 MG PO TABS: 100.0000 mg | ORAL_TABLET | Freq: Two times a day (BID) | ORAL | 0 refills | Status: DC

## 2021-03-25 NOTE — Patient Instructions (Signed)
Randa Lynn, thank you for joining Piedad Climes, PA-C for today's virtual visit.  While this provider is not your primary care provider (PCP), if your PCP is located in our provider database this encounter information will be shared with them immediately following your visit.  Consent: (Patient) Randa Lynn provided verbal consent for this virtual visit at the beginning of the encounter.  Current Medications:  Current Outpatient Medications:    famotidine (PEPCID) 20 MG tablet, Take 1 tablet (20 mg total) by mouth 2 (two) times daily., Disp: 30 tablet, Rfl: 0   ibuprofen (ADVIL) 800 MG tablet, Take 1 tablet (800 mg total) by mouth 3 (three) times daily., Disp: 21 tablet, Rfl: 0   lidocaine (XYLOCAINE) 2 % solution, Use as directed 15 mLs in the mouth or throat every 4 (four) hours as needed for mouth pain., Disp: 200 mL, Rfl: 0   omeprazole (PRILOSEC) 20 MG capsule, Take 1 capsule (20 mg total) by mouth daily., Disp: 30 capsule, Rfl: 0   ondansetron (ZOFRAN ODT) 4 MG disintegrating tablet, Take 1 tablet (4 mg total) by mouth every 8 (eight) hours as needed for nausea or vomiting., Disp: 20 tablet, Rfl: 0   sucralfate (CARAFATE) 1 g tablet, Take 1 tablet (1 g total) by mouth 4 (four) times daily -  with meals and at bedtime., Disp: 30 tablet, Rfl: 0   Medications ordered in this encounter:  No orders of the defined types were placed in this encounter.    *If you need refills on other medications prior to your next appointment, please contact your pharmacy*  Follow-Up: Call back or seek an in-person evaluation if the symptoms worsen or if the condition fails to improve as anticipated.  Other Instructions Take the antibiotic as directed. Start a daily probiotic and take daily for up to a few weeks after finishing antibiotic. Keep skin clean and dry. Once this is resolved you can apply small amount of Hibiclens solution to the scalp and neck maybe 1-2 x weekly to cut back  on the bacterial load of scalp and hopefully help prevent further occurrences.   Folliculitis Folliculitis is inflammation of the hair follicles. Folliculitis most commonly occurs on the scalp, thighs, legs, back, and buttocks. However, it can occur anywhere on the body. What are the causes? This condition may be caused by: A bacterial infection (common). A fungal infection. A viral infection. Contact with certain chemicals, especially oils and tars. Shaving or waxing. Greasy ointments or creams applied to the skin. Long-lasting folliculitis and folliculitis that keeps coming back may be caused by bacteria. This bacteria can live anywhere on your skin and is often found in the nostrils. What increases the risk? You are more likely to develop this condition if you have: A weakened immune system. Diabetes. Obesity. What are the signs or symptoms? Symptoms of this condition include: Redness. Soreness. Swelling. Itching. Small white or yellow, pus-filled, itchy spots (pustules) that appear over a reddened area. If there is an infection that goes deep into the follicle, these may develop into a boil (furuncle). A group of closely packed boils (carbuncle). These tend to form in hairy, sweaty areas of the body. How is this diagnosed? This condition is diagnosed with a skin exam. To find what is causing the condition, your health care provider may take a sample of one of the pustules or boils for testing in a lab. How is this treated? This condition may be treated by: Applying warm compresses to the affected areas.  Taking an antibiotic medicine or applying an antibiotic medicine to the skin. Applying or bathing with an antiseptic solution. Taking an over-the-counter medicine to help with itching. Having a procedure to drain any pustules or boils. This may be done if a pustule or boil contains a lot of pus or fluid. Having laser hair removal. This may be done to treat long-lasting  folliculitis. Follow these instructions at home: Managing pain and swelling  If directed, apply heat to the affected area as often as told by your health care provider. Use the heat source that your health care provider recommends, such as a moist heat pack or a heating pad. Place a towel between your skin and the heat source. Leave the heat on for 20-30 minutes. Remove the heat if your skin turns bright red. This is especially important if you are unable to feel pain, heat, or cold. You may have a greater risk of getting burned. General instructions If you were prescribed an antibiotic medicine, take it or apply it as told by your health care provider. Do not stop using the antibiotic even if your condition improves. Check the irritated area every day for signs of infection. Check for: Redness, swelling, or pain. Fluid or blood. Warmth. Pus or a bad smell. Do not shave irritated skin. Take over-the-counter and prescription medicines only as told by your health care provider. Keep all follow-up visits as told by your health care provider. This is important. Get help right away if: You have more redness, swelling, or pain in the affected area. Red streaks are spreading from the affected area. You have a fever. Summary Folliculitis is inflammation of the hair follicles. Folliculitis most commonly occurs on the scalp, thighs, legs, back, and buttocks. This condition may be treated by taking an antibiotic medicine or applying an antibiotic medicine to the skin, and applying or bathing with an antiseptic solution. If you were prescribed an antibiotic medicine, take it or apply it as told by your health care provider. Do not stop using the antibiotic even if your condition improves. Get help right away if you have new or worsening symptoms. Keep all follow-up visits as told by your health care provider. This is important. This information is not intended to replace advice given to you by your  health care provider. Make sure you discuss any questions you have with your health care provider. Document Revised: 01/19/2018 Document Reviewed: 01/27/2018 Elsevier Patient Education  2022 [employer].   If you have been instructed to have an in-person evaluation today at a local Urgent Care facility, please use the link below. It will take you to a list of all of our available [facility], including address, phone number and hours of operation. Please do not delay care.  [facility]  If you or a family member do not have a primary care provider, use the link below to schedule a visit and establish care. When you choose a [facility] primary care physician or advanced practice provider, you gain a long-term partner in health. Find a Primary Care Provider  Learn more about [facility] in-office and virtual care options: [facility] - Get Care Now

## 2021-03-25 NOTE — Progress Notes (Signed)
Virtual Visit Consent   Stacy Hayes, you are scheduled for a virtual visit with a [facility] provider today.     Just as with appointments in the office, your consent must be obtained to participate.  Your consent will be active for this visit and any virtual visit you may have with one of our providers in the next 365 days.     If you have a MyChart account, a copy of this consent can be sent to you electronically.  All virtual visits are billed to your insurance company just like a traditional visit in the office.    As this is a virtual visit, video technology does not allow for your provider to perform a traditional examination.  This may limit your provider's ability to fully assess your condition.  If your provider identifies any concerns that need to be evaluated in person or the need to arrange testing (such as labs, EKG, etc.), we will make arrangements to do so.     Although advances in technology are sophisticated, we cannot ensure that it will always work on either your end or our end.  If the connection with a video visit is poor, the visit may have to be switched to a telephone visit.  With either a video or telephone visit, we are not always able to ensure that we have a secure connection.     I need to obtain your verbal consent now.   Are you willing to proceed with your visit today?    Stacy Hayes has provided verbal consent on 03/25/2021 for a virtual visit (video or telephone).   Stacy Hayes, [place]   Date: 03/25/2021 3:21 PM   Virtual Visit via Video Note   I, Stacy Hayes, connected with  Stacy Hayes  ([id number], 2001-01-27) on 03/25/21 at  3:15 PM EDT by a video-enabled telemedicine application and verified that I am speaking with the correct person using two identifiers.  Location: Patient: Virtual Visit Location Patient: Home Provider: Virtual Visit Location Provider: Home Office   I discussed the limitations of evaluation and  management by telemedicine and the availability of in person appointments. The patient expressed understanding and agreed to proceed.    History of Present Illness: Stacy Hayes is a 20 y.o. who identifies as a female who was assigned female at birth, and is being seen today for possible folliculitis. Has history of scalp folliculitis over the past couple of years, most recently 2 months ago. This was treated successfully with Doxycycline. Current issue has been present for a few weeks. Denies fevers, chills, fatigue. Is quite tender. Marland Kitchen  HPI: HPI  Problems: There are no problems to display for this patient.   Allergies: No Known Allergies Medications:  Current Outpatient Medications:    doxycycline (VIBRA-TABS) 100 MG tablet, Take 1 tablet (100 mg total) by mouth 2 (two) times daily., Disp: 28 tablet, Rfl: 0  Observations/Objective: Patient is well-developed, well-nourished in no acute distress.  Resting comfortably at home.  Head is normocephalic, atraumatic.  No labored breathing. Speech is clear and coherent with logical content.  Patient is alert and oriented at baseline.   Assessment and Plan: 1. Folliculitis - doxycycline (VIBRA-TABS) 100 MG tablet; Take 1 tablet (100 mg total) by mouth 2 (two) times daily.  Dispense: 28 tablet; Refill: 0 Giving there is history of recurrence and more significant symptoms, will restart Tx with extended course of Doxycycline. She is to start probiotic daily while on antibiotics and for a  few weeks after finishing. Supportive measures and preventive measures (hibiclens) discussed. She is to follow-up with her PCP for ongoing management/prevention.   Follow Up Instructions: I discussed the assessment and treatment plan with the patient. The patient was provided an opportunity to ask questions and all were answered. The patient agreed with the plan and demonstrated an understanding of the instructions.  A copy of instructions were sent to the  patient via MyChart.  The patient was advised to call back or seek an in-person evaluation if the symptoms worsen or if the condition fails to improve as anticipated.  Time:  I spent 14 minutes with the patient via telehealth technology discussing the above problems/concerns.    Stacy Climes, PA-C

## 2021-05-24 ENCOUNTER — Encounter: Payer: Self-pay | Admitting: Family Medicine

## 2021-05-24 ENCOUNTER — Telehealth: Payer: Self-pay | Admitting: Family Medicine

## 2021-05-24 DIAGNOSIS — Z20828 Contact with and (suspected) exposure to other viral communicable diseases: Secondary | ICD-10-CM

## 2021-05-24 DIAGNOSIS — R0602 Shortness of breath: Secondary | ICD-10-CM

## 2021-05-24 MED ORDER — OSELTAMIVIR PHOSPHATE 75 MG PO CAPS: 75.0000 mg | ORAL_CAPSULE | Freq: Two times a day (BID) | ORAL | 0 refills | Status: AC

## 2021-05-24 MED ORDER — ALBUTEROL SULFATE HFA 108 (90 BASE) MCG/ACT IN AERS: 1.0000 | INHALATION_SPRAY | Freq: Four times a day (QID) | RESPIRATORY_TRACT | 0 refills | Status: DC | PRN

## 2021-05-24 NOTE — Progress Notes (Signed)
E visit for Flu like symptoms   We are sorry that you are not feeling well.  Here is how we plan to help! Based on what you have shared with me it looks like you may have a respiratory virus that may be influenza.  Influenza or "the flu" is   an infection caused by a respiratory virus. The flu virus is highly contagious and persons who did not receive their yearly flu vaccination may "catch" the flu from close contact.  We have anti-viral medications to treat the viruses that cause this infection. They are not a "cure" and only shorten the course of the infection. These prescriptions are most effective when they are given within the first 2 days of "flu" symptoms. Antiviral medication are indicated if you have a high risk of complications from the flu. You should  also consider an antiviral medication if you are in close contact with someone who is at risk. These medications can help patients avoid complications from the flu  but have side effects that you should know. Possible side effects from Tamiflu or oseltamivir include nausea, vomiting, diarrhea, dizziness, headaches, eye redness, sleep problems or other respiratory symptoms. You should not take Tamiflu if you have an allergy to oseltamivir or any to the ingredients in Tamiflu.  Based upon your symptoms and potential risk factors I have prescribed Oseltamivir (Tamiflu).  It has been sent to your designated pharmacy.  You will take one 75 mg capsule orally twice a day for the next 5 days. Given your shortness of breath- I will order an inhaler as well.  If not improved you need to be seen in person.   ANYONE WHO HAS FLU SYMPTOMS SHOULD: Stay home. The flu is highly contagious and going out or to work exposes others! Be sure to drink plenty of fluids. Water is fine as well as fruit juices, sodas and electrolyte beverages. You may want to stay away from caffeine or alcohol. If you are nauseated, try taking small sips of liquids. How do you know  if you are getting enough fluid? Your urine should be a pale yellow or almost colorless. Get rest. Taking a steamy shower or using a humidifier may help nasal congestion and ease sore throat pain. Using a saline nasal spray works much the same way. Cough drops, hard candies and sore throat lozenges may ease your cough. Line up a caregiver. Have someone check on you regularly.   GET HELP RIGHT AWAY IF: You cannot keep down liquids or your medications. You become short of breath Your fell like you are going to pass out or loose consciousness. Your symptoms persist after you have completed your treatment plan MAKE SURE YOU  Understand these instructions. Will watch your condition. Will get help right away if you are not doing well or get worse.  Your e-visit answers were reviewed by a board certified advanced clinical practitioner to complete your personal care plan.  Depending on the condition, your plan could have included both over the counter or prescription medications.  If there is a problem please reply  once you have received a response from your provider.  Your safety is important to [place].  If you have drug allergies check your prescription carefully.    You can use MyChart to ask questions about today's visit, request a non-urgent call back, or ask for a work or school excuse for 24 hours related to this e-Visit. If it has been greater than 24 hours you will need  to follow up with your provider, or enter a new e-Visit to address those concerns.  You will get an e-mail in the next two days asking about your experience.  I hope that your e-visit has been valuable and will speed your recovery. Thank you for using e-visits.  I provided 5 minutes of non face-to-face time during this encounter for chart review, medication and order placement, as well as and documentation.

## 2021-05-26 ENCOUNTER — Ambulatory Visit
Admission: RE | Admit: 2021-05-26 | Discharge: 2021-05-26 | Disposition: A | Discharge status: Home / Self Care | Payer: Self-pay | Source: Ambulatory Visit

## 2021-05-26 VITALS — BP 110/73 | HR 115 | Temp 99.3°F | Resp 18

## 2021-05-26 DIAGNOSIS — R0602 Shortness of breath: Secondary | ICD-10-CM | POA: Insufficient documentation

## 2021-05-26 DIAGNOSIS — Z113 Encounter for screening for infections with a predominantly sexual mode of transmission: Secondary | ICD-10-CM | POA: Insufficient documentation

## 2021-05-26 DIAGNOSIS — E860 Dehydration: Secondary | ICD-10-CM | POA: Insufficient documentation

## 2021-05-26 DIAGNOSIS — J9801 Acute bronchospasm: Secondary | ICD-10-CM | POA: Insufficient documentation

## 2021-05-26 DIAGNOSIS — J069 Acute upper respiratory infection, unspecified: Secondary | ICD-10-CM | POA: Insufficient documentation

## 2021-05-26 DIAGNOSIS — R062 Wheezing: Secondary | ICD-10-CM | POA: Insufficient documentation

## 2021-05-26 DIAGNOSIS — R0981 Nasal congestion: Secondary | ICD-10-CM | POA: Insufficient documentation

## 2021-05-26 LAB — POCT URINALYSIS DIP (MANUAL ENTRY)
Blood, UA: NEGATIVE
Glucose, UA: NEGATIVE mg/dL
Leukocytes, UA: NEGATIVE
Nitrite, UA: NEGATIVE
Protein Ur, POC: 100 mg/dL — AB
Spec Grav, UA: 1.025 (ref 1.010–1.025)
Urobilinogen, UA: 0.2 E.U./dL
pH, UA: 6 (ref 5.0–8.0)

## 2021-05-26 LAB — POCT INFLUENZA A/B
Influenza A, POC: POSITIVE — AB
Influenza B, POC: NEGATIVE

## 2021-05-26 LAB — POCT URINE PREGNANCY: Preg Test, Ur: NEGATIVE

## 2021-05-26 MED ORDER — PREDNISONE 10 MG PO TABS: 20.0000 mg | ORAL_TABLET | Freq: Every day | ORAL | 0 refills | Status: AC

## 2021-05-26 MED ORDER — ALBUTEROL SULFATE HFA 108 (90 BASE) MCG/ACT IN AERS
1.0000 | INHALATION_SPRAY | Freq: Four times a day (QID) | RESPIRATORY_TRACT | 0 refills | Status: DC | PRN
Start: 2021-05-26 — End: 2021-12-22

## 2021-05-26 NOTE — Discharge Instructions (Addendum)
You had some mild wheezing on exam today, I recommend that you begin a 5-day course of steroids to settle down the inflammation in your airways.  I have sent a prescription to your pharmacy.  Please take as prescribed.  Your rapid flu test today is positive.  Please finish all Tamiflu as prescribed.  Your urine sample was cloudy on exam today, I am concerned that you are not staying well-hydrated.  Please be sure you are consuming between 80 and 100 ounces of clear fluids daily.  The results of your STD testing today will be made available to you once received.  They will be posted to your MyChart and, if any of your results are abnormal, you will receive a phone call with those results along with further instructions regarding treatment.

## 2021-05-26 NOTE — ED Provider Notes (Signed)
[facility]    CSN: [id number] Arrival date & time: 05/26/21  1319    HISTORY   Chief Complaint  Patient presents with   Cough   HPI Stacy Hayes is a 20 y.o. female. PresentPatient complains of nasal congestion and being unable to take a deep breath with out coughing.  Patient states she had bronchitis earlier this year and sinus infection, states she feels like she has similar symptoms today.  Per my observation, patient is not demonstrating any signs of respiratory distress at this time.  Patient does have a heart rate of 115 on arrival with a mildly elevated temperature, oxygen respirations are normal.  Patient states her symptoms started 4 days ago.  Patient also requests STD screening, denies any symptoms of STD at this time.  Patient denies known STD exposure.  The history is provided by the patient.  History reviewed. No pertinent past medical history. There are no problems to display for this patient.  History reviewed. No pertinent surgical history. OB History   No obstetric history on file.    Home Medications    Prior to Admission medications   Medication Sig Start Date End Date Taking? Authorizing Provider  ibuprofen (ADVIL) 200 MG tablet Take 200 mg by mouth every 6 (six) hours as needed.   Yes [provider]  predniSONE (DELTASONE) 10 MG tablet Take 2 tablets (20 mg total) by mouth daily for 5 days. 05/26/21 05/31/21 Yes Theadora Rama Scales, PA-C  albuterol (VENTOLIN HFA) 108 (90 Base) MCG/ACT inhaler Inhale 1-2 puffs into the lungs every 6 (six) hours as needed for wheezing or shortness of breath. 05/26/21   Theadora Rama Scales, PA-C  oseltamivir (TAMIFLU) 75 MG capsule Take 1 capsule (75 mg total) by mouth 2 (two) times daily for 5 days. 05/24/21 05/29/21  Freddy Finner, NP   Family History Family History  Problem Relation Age of Onset   Hypothyroidism Mother    Social History Social History   Tobacco Use   Smoking  status: Never   Smokeless tobacco: Never  Vaping Use   Vaping Use: Every day  Substance Use Topics   Alcohol use: Yes    Comment: socially   Drug use: Never   Allergies   Patient has no known allergies.  Review of Systems Review of Systems Pertinent findings noted in history of present illness.   Physical Exam Triage Vital Signs ED Triage Vitals  Enc Vitals Group     BP 04/30/21 0827 (!) 147/82     Pulse Rate 04/30/21 0827 72     Resp 04/30/21 0827 18     Temp 04/30/21 0827 98.3 F (36.8 C)     Temp Source 04/30/21 0827 Oral     SpO2 04/30/21 0827 98 %     Weight --      Height --      Head Circumference --      Peak Flow --      Pain Score 04/30/21 0826 5     Pain Loc --      Pain Edu? --      Excl. in GC? --   No data found.  Updated Vital Signs BP 110/73 (BP Location: Right Arm)   Pulse (!) 115   Temp 99.3 F (37.4 C) (Oral)   Resp 18   LMP 05/11/2021   SpO2 96%   Physical Exam Vitals and nursing note reviewed.  Constitutional:      General: She is not in  acute distress.    Appearance: Normal appearance. She is not ill-appearing.  HENT:     Head: Normocephalic and atraumatic.     Salivary Glands: Right salivary gland is not diffusely enlarged or tender. Left salivary gland is not diffusely enlarged or tender.     Right Ear: Tympanic membrane, ear canal and external ear normal. No drainage. No middle ear effusion. There is no impacted cerumen. Tympanic membrane is not erythematous or bulging.     Left Ear: Tympanic membrane, ear canal and external ear normal. No drainage.  No middle ear effusion. There is no impacted cerumen. Tympanic membrane is not erythematous or bulging.     Nose: Nose normal. No nasal deformity, septal deviation, mucosal edema, congestion or rhinorrhea.     Right Turbinates: Not enlarged, swollen or pale.     Left Turbinates: Not enlarged, swollen or pale.     Right Sinus: No maxillary sinus tenderness or frontal sinus tenderness.      Left Sinus: No maxillary sinus tenderness or frontal sinus tenderness.     Mouth/Throat:     Lips: Pink. No lesions.     Mouth: Mucous membranes are moist. No oral lesions.     Pharynx: Oropharynx is clear. Uvula midline. No posterior oropharyngeal erythema or uvula swelling.     Tonsils: No tonsillar exudate. 0 on the right. 0 on the left.  Eyes:     General: Lids are normal.        Right eye: No discharge.        Left eye: No discharge.     Extraocular Movements: Extraocular movements intact.     Conjunctiva/sclera: Conjunctivae normal.     Right eye: Right conjunctiva is not injected.     Left eye: Left conjunctiva is not injected.  Neck:     Trachea: Trachea and phonation normal.  Cardiovascular:     Rate and Rhythm: Normal rate and regular rhythm.     Pulses: Normal pulses.     Heart sounds: Normal heart sounds. No murmur heard.   No friction rub. No gallop.  Pulmonary:     Effort: Pulmonary effort is normal. No accessory muscle usage, prolonged expiration or respiratory distress.     Breath sounds: No stridor, decreased air movement or transmitted upper airway sounds. Examination of the right-lower field reveals wheezing. Examination of the left-lower field reveals wheezing. Wheezing present. No decreased breath sounds, rhonchi or rales.  Chest:     Chest wall: No tenderness.  Musculoskeletal:        General: Normal range of motion.     Cervical back: Normal range of motion and neck supple. Normal range of motion.  Lymphadenopathy:     Cervical: No cervical adenopathy.  Skin:    General: Skin is warm and dry.     Findings: No erythema or rash.  Neurological:     General: No focal deficit present.     Mental Status: She is alert and oriented to person, place, and time.  Psychiatric:        Mood and Affect: Mood normal.        Behavior: Behavior normal.    Visual Acuity Right Eye Distance:   Left Eye Distance:   Bilateral Distance:    Right Eye Near:   Left Eye  Near:    Bilateral Near:     UC Couse / Diagnostics / Procedures:    EKG  Radiology No results found.  Procedures Procedures (including critical care time)  UC Diagnoses / Final Clinical Impressions(s)    Final diagnoses:  Nasal congestion  Screening examination for STD (sexually transmitted disease)  Wheezing  Viral upper respiratory infection  Mild dehydration  Bronchospasm   I have reviewed the triage vital signs and the nursing notes.  Pertinent labs & imaging results that were available during my care of the patient were reviewed by me and considered in my medical decision making (see chart for details).    Patient presents today with symptoms consistent with viral upper respiratory infection.  Rapid influenza test today was positive, patient is already taking Tamiflu as prescribed by another provider two days ago, patient did not advise anyone of this on arrival.  Patient advised to finish as ordered.  I have added a 5-day course of steroids for bronchospasm appreciated on exam, patient advised to continue using albuterol, patient request refill.  COVID/influenza/RSV testing was performed today, patient advised that the results of this test will be made available to them through their MyChart and, if positive, they will be contacted by phone with those results along with further recommendations.  Patient/parent/caregiver verbalized understanding and agreement of plan as discussed.  All questions were addressed during visit.  Please see discharge instructions below for further details of plan.  ED Prescriptions     Medication Sig Dispense Auth. Provider   predniSONE (DELTASONE) 10 MG tablet Take 2 tablets (20 mg total) by mouth daily for 5 days. 10 tablet Theadora Rama Scales, PA-C   albuterol (VENTOLIN HFA) 108 (90 Base) MCG/ACT inhaler Inhale 1-2 puffs into the lungs every 6 (six) hours as needed for wheezing or shortness of breath. 18 g Theadora Rama Scales, PA-C       PDMP not reviewed this encounter.  Pending results:  Labs Reviewed  POCT INFLUENZA A/B - Abnormal; Notable for the following components:      Result Value   Influenza A, POC Positive (*)    All other components within normal limits  POCT URINALYSIS DIP (MANUAL ENTRY) - Abnormal; Notable for the following components:   Clarity, UA cloudy (*)    Bilirubin, UA small (*)    Ketones, POC UA small (15) (*)    Protein Ur, POC =100 (*)    All other components within normal limits  POCT URINE PREGNANCY  CERVICOVAGINAL ANCILLARY ONLY     Medications Ordered in UC: Medications - No data to display  Discharge Instructions:   Discharge Instructions      You had some mild wheezing on exam today, I recommend that you begin a 5-day course of steroids to settle down the inflammation in your airways.  I have sent a prescription to your pharmacy.  Please take as prescribed.  Your rapid flu test today is positive.  Please finish all Tamiflu as prescribed.  Your urine sample was cloudy on exam today, I am concerned that you are not staying well-hydrated.  Please be sure you are consuming between 80 and 100 ounces of clear fluids daily.  The results of your STD testing today will be made available to you once received.  They will be posted to your MyChart and, if any of your results are abnormal, you will receive a phone call with those results along with further instructions regarding treatment.       Disposition Upon Discharge:  Patient presented with an acute illness with associated systemic symptoms and significant discomfort requiring urgent management. In my opinion, this is a condition that a prudent lay person (someone  who possesses an average knowledge of health and medicine) may potentially expect to result in complications if not addressed urgently such as respiratory distress, impairment of bodily function or dysfunction of bodily organs.   Routine symptom specific, illness  specific and/or disease specific instructions were discussed with the patient and/or caregiver at length.   As such, the patient has been evaluated and assessed, work-up was performed and treatment was provided in alignment with urgent care protocols and evidence based medicine.  Patient/parent/caregiver has been advised that the patient may require follow up for further testing and treatment if the symptoms continue in spite of treatment, as clinically indicated and appropriate.  The patient was tested for COVID-19, Influenza and/or RSV, then the patient/parent/guardian was advised to isolate at home pending the results of his/her diagnostic coronavirus test and potentially longer if they're positive. I have also advised pt that if his/her COVID-19 test returns positive, it's recommended to self-isolate for at least 10 days after symptoms first appeared AND until fever-free for 24 hours without fever reducer AND other symptoms have improved or resolved. Discussed self-isolation recommendations as well as instructions for household member/close contacts as per the [facility] and [place] DHHS, and also gave patient the COVID packet with this information.  Patient/parent/caregiver has been advised to return to the [facility] or PCP in 3-5 days if no better; to PCP or the Emergency Department if new signs and symptoms develop, or if the current signs or symptoms continue to change or worsen for further workup, evaluation and treatment as clinically indicated and appropriate  The patient will follow up with their current PCP if and as advised. If the patient does not currently have a PCP we will assist them in obtaining one.   The patient may need specialty follow up if the symptoms continue, in spite of conservative treatment and management, for further workup, evaluation, consultation and treatment as clinically indicated and appropriate.  Condition: stable for discharge home Home: take medications as prescribed; routine  discharge instructions as discussed; follow up as advised.    Theadora Rama Scales, PA-C 05/26/21 1433

## 2021-05-26 NOTE — ED Triage Notes (Addendum)
Pt reports having congestion, difficulty breathing (patient does not display s/s of respiratory distress). Started: 4 days ago   Pt also requesting to have STD check, she denies symptoms.

## 2021-06-01 ENCOUNTER — Telehealth (HOSPITAL_COMMUNITY): Payer: Self-pay | Admitting: Emergency Medicine

## 2021-06-01 ENCOUNTER — Telehealth: Payer: Self-pay

## 2021-06-01 LAB — CERVICOVAGINAL ANCILLARY ONLY
Bacterial Vaginitis (gardnerella): POSITIVE — AB
Candida Glabrata: NEGATIVE
Candida Vaginitis: NEGATIVE
Chlamydia: NEGATIVE
Comment: NEGATIVE
Comment: NEGATIVE
Comment: NEGATIVE
Comment: NEGATIVE
Comment: NEGATIVE
Comment: NORMAL
Neisseria Gonorrhea: POSITIVE — AB
Trichomonas: NEGATIVE

## 2021-06-01 MED ORDER — METRONIDAZOLE 500 MG PO TABS: 500.0000 mg | ORAL_TABLET | Freq: Two times a day (BID) | ORAL | 0 refills | Status: DC

## 2021-06-01 NOTE — Telephone Encounter (Signed)
Per protocol, patient will need treatment with IM Rocephin 500mg  and Metronidazole Contacted patient by phone.  Verified identity using two identifiers.  Provided positive result.  Reviewed safe sex practices, notifying partners, and refraining from sexual activities for 7 days from time of treatment.  Patient verified understanding, all questions answered.   HHS notified  Verified pharmacy, prescription sent

## 2021-06-03 ENCOUNTER — Ambulatory Visit
Admission: RE | Admit: 2021-06-03 | Discharge: 2021-06-03 | Disposition: A | Discharge status: Home / Self Care | Payer: Self-pay | Source: Ambulatory Visit | Attending: Emergency Medicine | Admitting: Emergency Medicine

## 2021-06-03 ENCOUNTER — Other Ambulatory Visit: Payer: Self-pay

## 2021-06-03 VITALS — BP 118/82 | HR 83 | Temp 99.1°F | Resp 16

## 2021-06-03 DIAGNOSIS — B9689 Other specified bacterial agents as the cause of diseases classified elsewhere: Secondary | ICD-10-CM

## 2021-06-03 DIAGNOSIS — A549 Gonococcal infection, unspecified: Secondary | ICD-10-CM

## 2021-06-03 DIAGNOSIS — N760 Acute vaginitis: Secondary | ICD-10-CM

## 2021-06-03 MED ORDER — CEFTRIAXONE SODIUM 500 MG IJ SOLR
500.0000 mg | Freq: Once | INTRAMUSCULAR | Status: AC
  Administered 2021-06-03: 500 mg via INTRAMUSCULAR

## 2021-06-03 NOTE — Discharge Instructions (Signed)
Your rash is not concerning for a new infection.  Please return as needed if new rashes or symptoms occur.  You received treatment for gonorrhea today with Rocephin 500 mg injection.  There are no further treatment recommendations at this time.  Thank you for visiting urgent care, I hope you have a great day.

## 2021-06-03 NOTE — ED Provider Notes (Signed)
[facility]    CSN: [id number] Arrival date & time: 06/03/21  1016    HISTORY   Chief Complaint  Patient presents with   Appt 10:30   Rash   HPI Stacy Hayes is a 20 y.o. female. On November 23, patient tested positive for BV and gonorrhea.  Patient was notified of her results on November 29.  Patient has been prescribed metronidazole tablets and was advised to present to urgent care for gonorrhea treatment with ceftriaxone 500 mg.  Patient states she has not picked up or started taking metronidazole for her BV.  Patient complains of a vaginal rash today.  Patient states the rash appeared yesterday.  The history is provided by the patient.  History reviewed. No pertinent past medical history. There are no problems to display for this patient.  History reviewed. No pertinent surgical history. OB History   No obstetric history on file.    Home Medications    Prior to Admission medications   Medication Sig Start Date End Date Taking? Authorizing Provider  albuterol (VENTOLIN HFA) 108 (90 Base) MCG/ACT inhaler Inhale 1-2 puffs into the lungs every 6 (six) hours as needed for wheezing or shortness of breath. 05/26/21   Theadora Rama Scales, PA-C  ibuprofen (ADVIL) 200 MG tablet Take 200 mg by mouth every 6 (six) hours as needed.    [provider]  metroNIDAZOLE (FLAGYL) 500 MG tablet Take 1 tablet (500 mg total) by mouth 2 (two) times daily. 06/01/21   Lamptey, Britta Mccreedy, MD   Family History Family History  Problem Relation Age of Onset   Hypothyroidism Mother    Social History Social History   Tobacco Use   Smoking status: Never   Smokeless tobacco: Never  Vaping Use   Vaping Use: Every day  Substance Use Topics   Alcohol use: Yes    Comment: socially   Drug use: Never   Allergies   Patient has no known allergies.  Review of Systems Review of Systems Pertinent findings noted in history of present illness.   Physical Exam Triage  Vital Signs ED Triage Vitals  Enc Vitals Group     BP 04/30/21 0827 (!) 147/82     Pulse Rate 04/30/21 0827 72     Resp 04/30/21 0827 18     Temp 04/30/21 0827 98.3 F (36.8 C)     Temp Source 04/30/21 0827 Oral     SpO2 04/30/21 0827 98 %     Weight --      Height --      Head Circumference --      Peak Flow --      Pain Score 04/30/21 0826 5     Pain Loc --      Pain Edu? --      Excl. in GC? --   No data found.  Updated Vital Signs BP 118/82 (BP Location: Right Arm)   Pulse 83   Temp 99.1 F (37.3 C) (Oral)   Resp 16   LMP 05/11/2021   SpO2 98%   Physical Exam Vitals and nursing note reviewed.  Constitutional:      General: She is not in acute distress.    Appearance: Normal appearance. She is not ill-appearing.  HENT:     Head: Normocephalic and atraumatic.  Eyes:     General: Lids are normal.        Right eye: No discharge.        Left eye: No discharge.  Extraocular Movements: Extraocular movements intact.     Conjunctiva/sclera: Conjunctivae normal.     Right eye: Right conjunctiva is not injected.     Left eye: Left conjunctiva is not injected.  Neck:     Trachea: Trachea and phonation normal.  Cardiovascular:     Rate and Rhythm: Normal rate and regular rhythm.     Pulses: Normal pulses.     Heart sounds: Normal heart sounds. No murmur heard.   No friction rub. No gallop.  Pulmonary:     Effort: Pulmonary effort is normal. No accessory muscle usage, prolonged expiration or respiratory distress.     Breath sounds: Normal breath sounds. No stridor, decreased air movement or transmitted upper airway sounds. No decreased breath sounds, wheezing, rhonchi or rales.  Chest:     Chest wall: No tenderness.  Genitourinary:    General: Normal vulva.     Vagina: No vaginal discharge.  Musculoskeletal:        General: Normal range of motion.     Cervical back: Normal range of motion and neck supple. Normal range of motion.  Lymphadenopathy:     Cervical:  No cervical adenopathy.  Skin:    General: Skin is warm and dry.     Findings: No erythema or rash.  Neurological:     General: No focal deficit present.     Mental Status: She is alert and oriented to person, place, and time.  Psychiatric:        Mood and Affect: Mood normal.        Behavior: Behavior normal.    Visual Acuity Right Eye Distance:   Left Eye Distance:   Bilateral Distance:    Right Eye Near:   Left Eye Near:    Bilateral Near:     UC Couse / Diagnostics / Procedures:    EKG  Radiology No results found.  Procedures Procedures (including critical care time)  UC Diagnoses / Final Clinical Impressions(s)   I have reviewed the triage vital signs and the nursing notes.  Pertinent labs & imaging results that were available during my care of the patient were reviewed by me and considered in my medical decision making (see chart for details).   Final diagnoses:  Gonorrhea  Bacterial vaginosis   Patient reassured no vulvovaginal rash was apparent on inspection today.  Patient was provided with ceftriaxone for gonorrhea, patient advised to pick up prescription for BV.  Disposition Upon Discharge:  Condition: stable for discharge home  Patient presents today with concerns for exposure to sexually transmitted disease, requesting testing.  STD screening was performed as indicated.  Patient has been advised that the results of screening will be made available to them via MyChart and, if there are any positive findings, they will be contacted by phone, recommendations for treatment will be advised and prescriptions will be provided as indicated based on clinical guidelines.  Patient has also been advised that if treatment is recommended, they should abstain from sexual intercourse of all forms until treatment is complete.  Patient has further been advised that once treatment is complete, they have not had a complete resolution of their symptoms, if any, they should  continue to abstain from sexual intercourse with all forms and follow-up with her primary care provider or return to urgent care for repeat testing.  As such, the patient has been evaluated and assessed, work-up was performed and treatment was provided in alignment with urgent care protocols and evidence based medicine.  Patient/parent/caregiver has  been advised that the patient may require follow up for further testing and/or treatment if the symptoms continue in spite of treatment, as clinically indicated and appropriate.  Routine symptom specific, illness specific and/or disease specific instructions were discussed with the patient and/or caregiver at length.  Prevention strategies for avoiding STD exposure were also discussed.  The patient will follow up with their current PCP if and as advised. If the patient does not currently have a PCP we will assist them in obtaining one.   The patient may need specialty follow up if the symptoms continue, in spite of conservative treatment and management, for further workup, evaluation, consultation and treatment as clinically indicated and appropriate.  Patient/parent/caregiver verbalized understanding and agreement of plan as discussed.  All questions were addressed during visit.  Please see discharge instructions below for further details of plan.  ED Prescriptions   None    PDMP not reviewed this encounter.  Pending results:  Labs Reviewed - No data to display  Medications Ordered in UC: Medications  cefTRIAXone (ROCEPHIN) injection 500 mg (500 mg Intramuscular Given 06/03/21 1119)    Discharge Instructions:   Discharge Instructions      Your rash is not concerning for a new infection.  Please return as needed if new rashes or symptoms occur.  You received treatment for gonorrhea today with Rocephin 500 mg injection.  There are no further treatment recommendations at this time.  Thank you for visiting urgent care, I hope you have a great  day.         Theadora Rama Scales, PA-C 06/03/21 1356

## 2021-06-03 NOTE — ED Triage Notes (Signed)
Pt presents here today for gonorrhea.  She reports having a vaginal rash that she noticed yesterday (she denies itching and pain to the area). She has not began taking her BV treatment.

## 2021-06-13 ENCOUNTER — Telehealth: Payer: Self-pay | Admitting: Emergency Medicine

## 2021-06-13 DIAGNOSIS — J40 Bronchitis, not specified as acute or chronic: Secondary | ICD-10-CM

## 2021-06-13 DIAGNOSIS — J029 Acute pharyngitis, unspecified: Secondary | ICD-10-CM

## 2021-06-13 DIAGNOSIS — J069 Acute upper respiratory infection, unspecified: Secondary | ICD-10-CM

## 2021-06-13 MED ORDER — BENZONATATE 100 MG PO CAPS: 100.0000 mg | ORAL_CAPSULE | Freq: Two times a day (BID) | ORAL | 0 refills | Status: DC | PRN

## 2021-06-13 MED ORDER — AZITHROMYCIN 250 MG PO TABS: ORAL_TABLET | ORAL | 0 refills | Status: AC

## 2021-06-13 MED ORDER — PREDNISONE 10 MG (21) PO TBPK: ORAL_TABLET | Freq: Every day | ORAL | 0 refills | Status: DC

## 2021-06-13 NOTE — Progress Notes (Signed)
We are sorry that you are not feeling well.  Here is how we plan to help!  Based on your presentation I believe you most likely have A cough due to bacteria.  When patients have a fever and a productive cough with a change in color or increased sputum production, we are concerned about bacterial bronchitis.  If left untreated it can progress to pneumonia.  If your symptoms do not improve with your treatment plan it is important that you contact your provider.   I have prescribed Azithromyin 250 mg: two tablets now and then one tablet daily for 4 additonal days    In addition you may use A prescription cough medication called Tessalon Perles 100mg. You may take 1-2 capsules every 8 hours as needed for your cough.  Prednisone 10 mg daily for 6 days (see taper instructions below)  Directions for 6 day taper: Day 1: 2 tablets before breakfast, 1 after both lunch & dinner and 2 at bedtime Day 2: 1 tab before breakfast, 1 after both lunch & dinner and 2 at bedtime Day 3: 1 tab at each meal & 1 at bedtime Day 4: 1 tab at breakfast, 1 at lunch, 1 at bedtime Day 5: 1 tab at breakfast & 1 tab at bedtime Day 6: 1 tab at breakfast  From your responses in the eVisit questionnaire you describe inflammation in the upper respiratory tract which is causing a significant cough.  This is commonly called Bronchitis and has four common causes:   Allergies Viral Infections Acid Reflux Bacterial Infection Allergies, viruses and acid reflux are treated by controlling symptoms or eliminating the cause. An example might be a cough caused by taking certain blood pressure medications. You stop the cough by changing the medication. Another example might be a cough caused by acid reflux. Controlling the reflux helps control the cough.  USE OF BRONCHODILATOR ("RESCUE") INHALERS: There is a risk from using your bronchodilator too frequently.  The risk is that over-reliance on a medication which only relaxes the muscles  surrounding the breathing tubes can reduce the effectiveness of medications prescribed to reduce swelling and congestion of the tubes themselves.  Although you feel brief relief from the bronchodilator inhaler, your asthma may actually be worsening with the tubes becoming more swollen and filled with mucus.  This can delay other crucial treatments, such as oral steroid medications. If you need to use a bronchodilator inhaler daily, several times per day, you should discuss this with your provider.  There are probably better treatments that could be used to keep your asthma under control.     HOME CARE Only take medications as instructed by your medical team. Complete the entire course of an antibiotic. Drink plenty of fluids and get plenty of rest. Avoid close contacts especially the very young and the elderly Cover your mouth if you cough or cough into your sleeve. Always remember to wash your hands A steam or ultrasonic humidifier can help congestion.   GET HELP RIGHT AWAY IF: You develop worsening fever. You become short of breath You cough up blood. Your symptoms persist after you have completed your treatment plan MAKE SURE YOU  Understand these instructions. Will watch your condition. Will get help right away if you are not doing well or get worse.    Thank you for choosing an e-visit.  Your e-visit answers were reviewed by a board certified advanced clinical practitioner to complete your personal care plan. Depending upon the condition, your plan could   have included both over the counter or prescription medications.  Please review your pharmacy choice. Make sure the pharmacy is open so you can pick up prescription now. If there is a problem, you may contact your provider through [employer] and have the prescription routed to another pharmacy.  Your safety is important to [place]. If you have drug allergies check your prescription carefully.   For the next 24 hours you can use  MyChart to ask questions about today's visit, request a non-urgent call back, or ask for a work or school excuse. You will get an email in the next two days asking about your experience. I hope that your e-visit has been valuable and will speed your recovery.

## 2021-06-13 NOTE — Progress Notes (Signed)
I have spent 5 minutes in review of e-visit questionnaire, review and updating patient chart, medical decision making and response to patient.   Modest Draeger, PA-C    

## 2021-09-29 ENCOUNTER — Telehealth: Payer: Self-pay | Admitting: Physician Assistant

## 2021-09-29 DIAGNOSIS — M546 Pain in thoracic spine: Secondary | ICD-10-CM

## 2021-09-29 MED ORDER — CYCLOBENZAPRINE HCL 10 MG PO TABS
5.0000 mg | ORAL_TABLET | Freq: Three times a day (TID) | ORAL | 0 refills | Status: DC | PRN
Start: 2021-09-29 — End: 2021-12-22

## 2021-09-29 MED ORDER — NAPROXEN 500 MG PO TABS: 500.0000 mg | ORAL_TABLET | Freq: Two times a day (BID) | ORAL | 0 refills | Status: DC

## 2021-09-29 NOTE — Progress Notes (Signed)

## 2021-10-05 DIAGNOSIS — Z6831 Body mass index (BMI) 31.0-31.9, adult: Secondary | ICD-10-CM | POA: Diagnosis not present

## 2021-10-05 DIAGNOSIS — R590 Localized enlarged lymph nodes: Secondary | ICD-10-CM | POA: Diagnosis not present

## 2021-10-12 ENCOUNTER — Telehealth: Payer: BC Managed Care – PPO | Admitting: Physician Assistant

## 2021-10-12 DIAGNOSIS — F331 Major depressive disorder, recurrent, moderate: Secondary | ICD-10-CM | POA: Insufficient documentation

## 2021-10-12 DIAGNOSIS — F5002 Anorexia nervosa, binge eating/purging type: Secondary | ICD-10-CM | POA: Diagnosis not present

## 2021-10-12 DIAGNOSIS — F411 Generalized anxiety disorder: Secondary | ICD-10-CM | POA: Insufficient documentation

## 2021-10-12 DIAGNOSIS — J45901 Unspecified asthma with (acute) exacerbation: Secondary | ICD-10-CM

## 2021-10-12 MED ORDER — ALBUTEROL SULFATE HFA 108 (90 BASE) MCG/ACT IN AERS: 2.0000 | INHALATION_SPRAY | Freq: Four times a day (QID) | RESPIRATORY_TRACT | 0 refills | Status: DC | PRN

## 2021-10-12 MED ORDER — PREDNISONE 20 MG PO TABS: 40.0000 mg | ORAL_TABLET | Freq: Every day | ORAL | 0 refills | Status: AC

## 2021-10-12 NOTE — Progress Notes (Signed)
Visit for Asthma  Based on what you have shared with me, it looks like you may have a flare up of your asthma.  Asthma is a chronic (ongoing) lung disease which results in airway obstruction, inflammation and hyper-responsiveness.   Asthma symptoms vary from person to person, with common symptoms including nighttime awakening and decreased ability to participate in normal activities as a result of shortness of breath. It is often triggered by changes in weather, changes in the season, changes in air temperature, or inside (home, school, daycare or work) allergens such as animal dander, mold, mildew, woodstoves or cockroaches.   It can also be triggered by hormonal changes, extreme emotion, physical exertion or an upper respiratory tract illness.     It is important to identify the trigger, and then eliminate or avoid the trigger if possible.   If you have been prescribed medications to be taken on a regular basis, it is important to follow the asthma action plan and to follow guidelines to adjust medication in response to increasing symptoms of decreased peak expiratory flow rate  Treatment: I have prescribed: Albuterol (Proventil HFA; Ventolin HFA) 108 (90 Base) MCG/ACT Inhaler 2 puffs into the lungs every six hours as needed for wheezing or shortness of breath and Prednisone 40mg by mouth per day for 5 - 7 days  HOME CARE . Only take medications as instructed by your medical team. . Consider wearing a mask or scarf to improve breathing air temperature have been shown to decrease irritation and decrease exacerbations . Get rest. . Taking a steamy shower or using a humidifier may help nasal congestion sand ease sore throat pain. You can place a towel over your head and breathe in the steam from hot water coming from a faucet. . Using a saline nasal spray works much the same way.  . Cough  drops, hare candies and sore throat lozenges may ease your cough.  . Avoid close contacts especially the very you and the elderly . Cover your mouth if you cough or sneeze . Always remember to wash your hands.    GET HELP RIGHT AWAY IF: . You develop worsening symptoms; breathlessness at rest, drowsy, confused or agitated, unable to speak in full sentences . You have coughing fits . You develop a severe headache or visual changes . You develop shortness of breath, difficulty breathing or start having chest pain . Your symptoms persist after you have completed your treatment plan . If your symptoms do not improve within 10 days  MAKE SURE YOU . Understand these instructions. . Will watch your condition. . Will get help right away if you are not doing well or get worse.   Your e-visit answers were reviewed by a board certified advanced clinical practitioner to complete your personal care plan, Depending upon the condition, your plan could have included both over the counter or prescription medications.  Please review your pharmacy choice. Your safety is important to us. If you have drug allergies check your prescription carefully. You can use MyChart to ask questions about today's visit, request a non-urgent call back, or ask for a work or school excuse for 24 hours related to this e-Visit. If it has been greater than 24 hours you will need to follow up with your provider, or enter a new e-Visit to address those concerns.  You will get an e-mail in the next two days asking about your experience. I hope that your e-visit has been valuable and will speed your recovery.   Thank you for using e-visits.  Approximately 5 minutes was spent documenting and reviewing patient's chart.  

## 2021-10-14 ENCOUNTER — Ambulatory Visit: Payer: BC Managed Care – PPO | Admitting: Physician Assistant

## 2021-10-21 ENCOUNTER — Ambulatory Visit: Payer: BC Managed Care – PPO | Admitting: Physician Assistant

## 2021-10-21 ENCOUNTER — Encounter: Payer: Self-pay | Admitting: Physician Assistant

## 2021-10-21 VITALS — BP 116/69 | HR 72 | Temp 98.7°F | Ht 66.0 in | Wt 181.4 lb

## 2021-10-21 DIAGNOSIS — J4530 Mild persistent asthma, uncomplicated: Secondary | ICD-10-CM | POA: Diagnosis not present

## 2021-10-21 DIAGNOSIS — R599 Enlarged lymph nodes, unspecified: Secondary | ICD-10-CM

## 2021-10-21 MED ORDER — FLUTICASONE FUROATE-VILANTEROL 100-25 MCG/ACT IN AEPB: 1.0000 | INHALATION_SPRAY | Freq: Every day | RESPIRATORY_TRACT | 0 refills | Status: AC

## 2021-10-21 MED ORDER — ALBUTEROL SULFATE HFA 108 (90 BASE) MCG/ACT IN AERS: 2.0000 | INHALATION_SPRAY | Freq: Four times a day (QID) | RESPIRATORY_TRACT | 2 refills | Status: DC | PRN

## 2021-10-21 NOTE — Progress Notes (Signed)
? ?Subjective:  ? ? Patient ID: Stacy Hayes, female    DOB: 17-Jan-2001, 21 y.o.   MRN: [id number] ? ?Chief Complaint  ?Patient presents with  ? Establish Care  ? Lymphadenopathy  ?  She denies pain, or SOB.  ? ? ?HPI ?21 y.o. patient presents today for new patient establishment with me.  Patient was previously established with Dr. Loleta Chance. ? ?Current Care Team: ?No specialists, has appt with the [facility] next month   ? ?Acute Concerns: ?Lymphadenopathy concerns for the last year, was supposed to have imaging, but had issues with insurance and scheduling through previous PCP. ?-Cervical, under chin, occiput, L postauricular is hard and most concerning to pt ?-Seemed to start after scalp infection last February ?-Not painful; sometimes enlarge and get tender with sickness ?-No noticeable changes in size ? ?Asthma ?-Needing a ProAir inhaler ?-Usually having issues in the mornings feeling chest tightness/ SOB ?-Worse with exercise as well  ?-Sister with asthma ? ? ? ?Past Medical History:  ?Diagnosis Date  ? Asthma   ? Depression   ? ? ?History reviewed. No pertinent surgical history. ? ?Family History  ?Problem Relation Age of Onset  ? Hypothyroidism Mother   ? Alcohol abuse Mother   ? Depression Mother   ? Diabetes Mother   ? Drug abuse Mother   ? Obesity Mother   ? Drug abuse Father   ? Asthma Sister   ? Depression Sister   ? Obesity Sister   ? ADD / ADHD Brother   ? Asthma Brother   ? Depression Brother   ? Learning disabilities Brother   ? Drug abuse Maternal Grandfather   ? Cancer Paternal Grandmother   ? Diabetes Paternal Grandmother   ? Obesity Paternal Grandmother   ? ? ?Social History  ? ?Tobacco Use  ? Smoking status: Never  ? Smokeless tobacco: Never  ?Vaping Use  ? Vaping Use: Every day  ?Substance Use Topics  ? Alcohol use: Yes  ?  Comment: socially  ? Drug use: Never  ?  ? ?No Known Allergies ? ?Review of Systems ?NEGATIVE UNLESS OTHERWISE INDICATED IN HPI ? ? ?   ?Objective:  ?  ? ?BP 116/69    Pulse 72   Temp 98.7 ?F (37.1 ?C) (Temporal)   Ht 5\' 6"  (1.676 m)   Wt 181 lb 6.4 oz (82.3 kg)   LMP 10/03/2021 (Approximate)   SpO2 97%   BMI 29.28 kg/m?  ? ?Wt Readings from Last 3 Encounters:  ?10/21/21 181 lb 6.4 oz (82.3 kg)  ?01/14/21 207 lb 3.7 oz (94 kg)  ? ? ?BP Readings from Last 3 Encounters:  ?10/21/21 116/69  ?06/03/21 118/82  ?05/26/21 110/73  ?  ? ?Physical Exam ?Vitals and nursing note reviewed.  ?Constitutional:   ?   Appearance: Normal appearance. She is normal weight. She is not toxic-appearing.  ?HENT:  ?   Head: Normocephalic and atraumatic.  ?   Right Ear: Tympanic membrane, ear canal and external ear normal.  ?   Left Ear: Tympanic membrane, ear canal and external ear normal.  ?   Nose: Nose normal.  ?   Mouth/Throat:  ?   Mouth: Mucous membranes are moist.  ?Eyes:  ?   Extraocular Movements: Extraocular movements intact.  ?   Conjunctiva/sclera: Conjunctivae normal.  ?   Pupils: Pupils are equal, round, and reactive to light.  ?Cardiovascular:  ?   Rate and Rhythm: Normal rate and regular rhythm.  ?  Pulses: Normal pulses.  ?   Heart sounds: Normal heart sounds.  ?Pulmonary:  ?   Effort: Pulmonary effort is normal.  ?   Breath sounds: Normal breath sounds.  ?Musculoskeletal:  ?   Cervical back: Normal range of motion and neck supple.  ?Lymphadenopathy:  ?   Head:  ?   Left side of head: Submental (x1), posterior auricular (x1) and occipital (x1) adenopathy present.  ?   Cervical: Cervical adenopathy present.  ?   Left cervical: Posterior cervical adenopathy (x1) present.  ?   Upper Body:  ?   Right upper body: No supraclavicular or axillary adenopathy.  ?   Left upper body: No supraclavicular or axillary adenopathy.  ?   Comments: I noted four superficial nontender mobile rubbery lymph nodes all less than 1 cm in size and very consistent with benign etiology  ?Skin: ?   General: Skin is warm and dry.  ?Neurological:  ?   General: No focal deficit present.  ?   Mental Status: She is  alert and oriented to person, place, and time.  ?Psychiatric:     ?   Mood and Affect: Mood normal.     ?   Behavior: Behavior normal.  ? ? ?   ?Assessment & Plan:  ? ?Problem List Items Addressed This Visit   ?None ?Visit Diagnoses   ? ? Lymph nodes enlarged    -  Primary  ? Mild persistent asthma without complication      ? Relevant Medications  ? albuterol (PROAIR HFA) 108 (90 Base) MCG/ACT inhaler  ? fluticasone furoate-vilanterol (BREO ELLIPTA) 100-25 MCG/ACT AEPB  ? ?  ? ? ? ?Meds ordered this encounter  ?Medications  ? albuterol (PROAIR HFA) 108 (90 Base) MCG/ACT inhaler  ?  Sig: Inhale 2 puffs into the lungs every 6 (six) hours as needed for wheezing or shortness of breath.  ?  Dispense:  1 each  ?  Refill:  2  ?  Order Specific Question:   Supervising Provider  ?  Answer:   Shelva Majestic [[id number]]  ? fluticasone furoate-vilanterol (BREO ELLIPTA) 100-25 MCG/ACT AEPB  ?  Sig: Inhale 1 puff into the lungs daily.  ?  Dispense:  28 each  ?  Refill:  0  ?  Order Specific Question:   Supervising Provider  ?  Answer:   Shelva Majestic [[id number]]  ? ?1. Lymph nodes enlarged ?Reassured the patient today that the lymph nodes noted on exam are all benign and consistent with normal lymph nodes.  Her CBC was normal last July.  I do not think imaging is warranted at this time.  Patient is going to monitor and let me know if anything changes. ? ?2. Mild persistent asthma without complication ?Uncontrolled as she complains of symptoms most mornings and with exercise.  I think she would benefit from once daily Breo if we can get this covered for her.  I also refilled a ProAir rescue inhaler for her to use as needed.  She will let me know how she is doing. ? ?Plan to follow-up in approximately 1 year for CPE and fasting labs or as needed. ? ? ?This note was prepared with assistance of Conservation officer, historic buildings. Occasional wrong-word or sound-a-like substitutions may have occurred due to the inherent limitations of  voice recognition software. ? ? ?Yomaira Solar M Nasario Czerniak, PA-C ?

## 2021-11-04 ENCOUNTER — Encounter: Payer: Self-pay | Admitting: Physician Assistant

## 2021-11-04 ENCOUNTER — Ambulatory Visit: Payer: BC Managed Care – PPO | Admitting: Physician Assistant

## 2021-11-04 VITALS — BP 114/80 | HR 88 | Temp 98.7°F | Ht 66.0 in | Wt 186.2 lb

## 2021-11-04 DIAGNOSIS — F32A Depression, unspecified: Secondary | ICD-10-CM

## 2021-11-04 DIAGNOSIS — F419 Anxiety disorder, unspecified: Secondary | ICD-10-CM | POA: Diagnosis not present

## 2021-11-04 DIAGNOSIS — F502 Bulimia nervosa: Secondary | ICD-10-CM

## 2021-11-04 NOTE — Patient Instructions (Signed)
If you develop suicidal thoughts, please tell someone and immediately proceed to our local 24/7 crisis center, [facility] the [facility].[address], [place], [place] [phone]. ? ?Ms Suezanne Jacquet will reach out to you to schedule appt for tomorrow.  ?

## 2021-11-04 NOTE — Progress Notes (Signed)
? ?Subjective:  ? ? Patient ID: Stacy Hayes, female    DOB: Dec 04, 2000, 21 y.o.   MRN: [id number] ? ?Chief Complaint  ?Patient presents with  ? Depression  ? Anxiety  ?  Pt states that she was recently seen by Psych and diagnosed with Anxiety and Depression. She would like to discuss being on a new medication. She also states being diagnosed with Anorexia and Bulimia nervosa.  ? ? ?Patient is in today for discussion about mental health concerns. ? ?Started seeing psychiatrist last month- Rose Fillers, [place] with [facility].Marland Kitchen Dx'd with anxiety, depression, eating disorders. Referred to [facility] outpt facility, but they didn't treat eating disorders. Goes back to psychiatrist next month. Hasn't been treated yet.  ? ?Started with eating issues around age 39. Never saw anyone for concerns, but family and friends were concerned. ? ?She had lost 20 lbs in a few weeks time this last "relapse."  ?Usually food avoidance. ?Sometimes purging after eating.  ? ?No SI, but knows what she is doing is hurting herself, but can't find a way to stop. ?Feels very sad, and this finally pushed her to get help.   ? ?Usually has headaches and fatigue. Takes laxatives for purging - but won't do it while working. Difficult time focusing.  ? ? ?Past Medical History:  ?Diagnosis Date  ? Asthma   ? Depression   ? ? ?No past surgical history on file. ? ?Family History  ?Problem Relation Age of Onset  ? Hypothyroidism Mother   ? Alcohol abuse Mother   ? Depression Mother   ? Diabetes Mother   ? Drug abuse Mother   ? Obesity Mother   ? Drug abuse Father   ? Asthma Sister   ? Depression Sister   ? Obesity Sister   ? ADD / ADHD Brother   ? Asthma Brother   ? Depression Brother   ? Learning disabilities Brother   ? Drug abuse Maternal Grandfather   ? Cancer Paternal Grandmother   ? Diabetes Paternal Grandmother   ? Obesity Paternal Grandmother   ? ? ?Social History  ? ?Tobacco Use  ? Smoking status: Never  ? Smokeless tobacco: Never  ?Vaping Use  ?  Vaping Use: Every day  ?Substance Use Topics  ? Alcohol use: Yes  ?  Comment: socially  ? Drug use: Never  ?  ? ?No Known Allergies ? ?Review of Systems ?NEGATIVE UNLESS OTHERWISE INDICATED IN HPI ? ? ?   ?Objective:  ?  ? ?BP 114/80 (BP Location: Left Arm, Patient Position: Sitting, Cuff Size: Large)   Pulse 88   Temp 98.7 ?F (37.1 ?C) (Temporal)   Ht 5\' 6"  (1.676 m)   Wt 186 lb 3.2 oz (84.5 kg)   LMP  (LMP Unknown)   SpO2 98%   BMI 30.05 kg/m?  ? ?Wt Readings from Last 3 Encounters:  ?11/04/21 186 lb 3.2 oz (84.5 kg)  ?10/21/21 181 lb 6.4 oz (82.3 kg)  ?01/14/21 207 lb 3.7 oz (94 kg)  ? ? ?BP Readings from Last 3 Encounters:  ?11/04/21 114/80  ?10/21/21 116/69  ?06/03/21 118/82  ?  ? ?Physical Exam ?Constitutional:   ?   Appearance: Normal appearance.  ?Cardiovascular:  ?   Rate and Rhythm: Normal rate and regular rhythm.  ?   Pulses: Normal pulses.  ?Pulmonary:  ?   Effort: Pulmonary effort is normal.  ?   Breath sounds: Normal breath sounds.  ?Neurological:  ?   Mental Status: She is  alert.  ?Psychiatric:     ?   Mood and Affect: Mood normal. Affect is not tearful.     ?   Speech: Speech normal.     ?   Behavior: Behavior normal. Behavior is cooperative.  ? ? ?   ?Assessment & Plan:  ? ?Problem List Items Addressed This Visit   ? ?  ? Other  ? Anxiety and depression - Primary  ? Bulimia nervosa  ? ?I personally reached out to Ms. Henson to collaborate care moving forward.  We are waiting to hear back from her at this time.  Per up-to-date guidelines Prozac would be a reasonable choice, certainly want to hear her thoughts as she will be managing psychiatric care. ? ?I received a call from Ms. Henson at 11:26 AM and spent a total of 4 minutes on the phone with her discussing patient's care.  Ms. Suezanne Jacquet is going to call the patient and have a visit with her tomorrow so they can continue to treat patient in best interest.  She is going to prescribe medication after discussion with patient. ? ?Patient advised  of behavioral health urgent care - see AVS. ? ? ?This note was prepared with assistance of Conservation officer, historic buildings. Occasional wrong-word or sound-a-like substitutions may have occurred due to the inherent limitations of voice recognition software. ? ?Time Spent: ?36 minutes of total time was spent on the date of the encounter performing the following actions: chart review prior to seeing the patient, obtaining history, performing a medically necessary exam, counseling on the treatment plan, placing orders, and documenting in our EHR.   ? ?Lonzell Dorris M Lillyonna Armstead, PA-C ?

## 2021-11-05 DIAGNOSIS — F331 Major depressive disorder, recurrent, moderate: Secondary | ICD-10-CM | POA: Diagnosis not present

## 2021-11-05 DIAGNOSIS — F411 Generalized anxiety disorder: Secondary | ICD-10-CM | POA: Diagnosis not present

## 2021-11-05 DIAGNOSIS — F5002 Anorexia nervosa, binge eating/purging type: Secondary | ICD-10-CM | POA: Diagnosis not present

## 2021-11-14 ENCOUNTER — Telehealth: Payer: BC Managed Care – PPO | Admitting: Physician Assistant

## 2021-11-14 DIAGNOSIS — L739 Follicular disorder, unspecified: Secondary | ICD-10-CM

## 2021-11-15 MED ORDER — SULFAMETHOXAZOLE-TRIMETHOPRIM 800-160 MG PO TABS: 1.0000 | ORAL_TABLET | Freq: Two times a day (BID) | ORAL | 0 refills | Status: DC

## 2021-11-15 NOTE — Progress Notes (Signed)
I have spent 5 minutes in review of e-visit questionnaire, review and updating patient chart, medical decision making and response to patient.   Ridge Lafond Cody Daine Gunther, PA-C    

## 2021-11-15 NOTE — Progress Notes (Signed)
E Visit for Cellulitis ? ?We are sorry that you are not feeling well. Here is how we plan to help! ? ?Based on what you shared with me it looks like you have cellulitis/folliculitis  Cellulitis looks like areas of skin redness, swelling, and warmth; it develops as a result of bacteria entering under the skin. Little red spots and/or bleeding can be seen in skin, and tiny surface sacs containing fluid can occur. Fever can be present. Cellulitis is almost always on one side of a body, and the lower limbs are the most common site of involvement.  ? ?I have prescribed:  Bactrim DS 1 tablet by mouth twice a day for 7 days ? ?HOME CARE: ? ?Take your medications as ordered and take all of them, even if the skin irritation appears to be healing.  ? ?GET HELP RIGHT AWAY IF: ? ?Symptoms that don't begin to go away within 48 hours. ?Severe redness persists or worsens ?If the area turns color, spreads or swells. ?If it blisters and opens, develops yellow-brown crust or bleeds. ?You develop a fever or chills. ?If the pain increases or becomes unbearable.  ?Are unable to keep fluids and food down. ? ?MAKE SURE YOU  ? ?Understand these instructions. ?Will watch your condition. ?Will get help right away if you are not doing well or get worse. ? ?Thank you for choosing an e-visit. ? ?Your e-visit answers were reviewed by a board certified advanced clinical practitioner to complete your personal care plan. Depending upon the condition, your plan could have included both over the counter or prescription medications. ? ?Please review your pharmacy choice. Make sure the pharmacy is open so you can pick up prescription now. If there is a problem, you may contact your provider through [employer] and have the prescription routed to another pharmacy.  Your safety is important to [place]. If you have drug allergies check your prescription carefully.  ? ?For the next 24 hours you can use MyChart to ask questions about today's visit, request  a non-urgent call back, or ask for a work or school excuse. ?You will get an email in the next two days asking about your experience. I hope that your e-visit has been valuable and will speed your recovery. ? ?

## 2021-11-16 ENCOUNTER — Ambulatory Visit (HOSPITAL_BASED_OUTPATIENT_CLINIC_OR_DEPARTMENT_OTHER): Payer: BC Managed Care – PPO | Admitting: Advanced Practice Midwife

## 2021-11-22 DIAGNOSIS — F5002 Anorexia nervosa, binge eating/purging type: Secondary | ICD-10-CM | POA: Diagnosis not present

## 2021-11-22 DIAGNOSIS — F331 Major depressive disorder, recurrent, moderate: Secondary | ICD-10-CM | POA: Diagnosis not present

## 2021-11-22 DIAGNOSIS — F411 Generalized anxiety disorder: Secondary | ICD-10-CM | POA: Diagnosis not present

## 2021-12-03 ENCOUNTER — Telehealth: Payer: BC Managed Care – PPO | Admitting: Family Medicine

## 2021-12-03 DIAGNOSIS — R0602 Shortness of breath: Secondary | ICD-10-CM

## 2021-12-03 NOTE — Progress Notes (Signed)
Stacy Hayes   Needs to be seen for shortness of breath and cough that are frequent in a patient with chart review of notes with reports of asthma and need for COVID testing   Message detail sent

## 2021-12-15 ENCOUNTER — Telehealth: Payer: BC Managed Care – PPO | Admitting: Physician Assistant

## 2021-12-15 ENCOUNTER — Ambulatory Visit: Payer: BC Managed Care – PPO

## 2021-12-15 DIAGNOSIS — B3731 Acute candidiasis of vulva and vagina: Secondary | ICD-10-CM

## 2021-12-16 MED ORDER — FLUCONAZOLE 150 MG PO TABS: 150.0000 mg | ORAL_TABLET | Freq: Once | ORAL | 0 refills | Status: AC

## 2021-12-16 NOTE — Progress Notes (Signed)

## 2021-12-16 NOTE — Progress Notes (Signed)
I have spent 5 minutes in review of e-visit questionnaire, review and updating patient chart, medical decision making and response to patient.   Shakeila Pfarr Cody Ozzie Remmers, PA-C    

## 2021-12-17 DIAGNOSIS — F5002 Anorexia nervosa, binge eating/purging type: Secondary | ICD-10-CM | POA: Diagnosis not present

## 2021-12-17 DIAGNOSIS — F4310 Post-traumatic stress disorder, unspecified: Secondary | ICD-10-CM | POA: Diagnosis not present

## 2021-12-21 ENCOUNTER — Telehealth: Payer: BC Managed Care – PPO | Admitting: Physician Assistant

## 2021-12-21 DIAGNOSIS — F411 Generalized anxiety disorder: Secondary | ICD-10-CM | POA: Diagnosis not present

## 2021-12-21 DIAGNOSIS — N760 Acute vaginitis: Secondary | ICD-10-CM

## 2021-12-21 DIAGNOSIS — F4310 Post-traumatic stress disorder, unspecified: Secondary | ICD-10-CM | POA: Diagnosis not present

## 2021-12-21 DIAGNOSIS — F331 Major depressive disorder, recurrent, moderate: Secondary | ICD-10-CM | POA: Diagnosis not present

## 2021-12-21 DIAGNOSIS — F5002 Anorexia nervosa, binge eating/purging type: Secondary | ICD-10-CM | POA: Diagnosis not present

## 2021-12-21 NOTE — Progress Notes (Signed)
Because of continued symptoms despite treatment via e-visit, I feel your condition warrants further evaluation and I recommend that you be seen in a face to face visit so you can get urine testing/vaginal swab to determine cause of symptoms and so proper treatment can be prescribed.    NOTE: There will be NO CHARGE for this eVisit   If you are having a true medical emergency please call 911.      For an urgent face to face visit, [place] has seven urgent care centers for your convenience:     [facility] Health Ur[facility] Center at [facility] Directions [phone] [address] [place], [place] [address]    [facility] Center [facility]) Get Driving Directions [phone] [address] [place], [place] [address]  [facility] Ur[facility] Center [facility]) Get Driving Directions [phone] [address] [place],  [place]  [address]  [facility] Health Urgent Care Center [facility] at [employer] Directions  [phone] [address] [place],  [place] [address]   [facility] Care at [facility] Get Driving Directions [phone] [address], [address] [place], [place] [address]   [facility] Health Urgent Care at [facility] Get Driving Directions  [phone] [address] [place], [place] [address]   [facility] Health Urgent Care at [facility] Directions [phone] [address] [place], [place] [address]  Your MyChart E-visit questionnaire answers were reviewed by a board certified advanced clinical practitioner to complete your personal care plan based on your specific symptoms.  Thank you for using e-Visits.

## 2021-12-22 ENCOUNTER — Ambulatory Visit
Admission: RE | Admit: 2021-12-22 | Discharge: 2021-12-22 | Disposition: A | Discharge status: Home / Self Care | Payer: BC Managed Care – PPO | Source: Ambulatory Visit | Attending: Emergency Medicine | Admitting: Emergency Medicine

## 2021-12-22 VITALS — BP 107/74 | HR 83 | Temp 98.8°F | Resp 20

## 2021-12-22 DIAGNOSIS — B3731 Acute candidiasis of vulva and vagina: Secondary | ICD-10-CM | POA: Diagnosis not present

## 2021-12-22 LAB — POCT URINALYSIS DIP (MANUAL ENTRY)
Bilirubin, UA: NEGATIVE
Blood, UA: NEGATIVE
Glucose, UA: NEGATIVE mg/dL
Ketones, POC UA: NEGATIVE mg/dL
Leukocytes, UA: NEGATIVE
Nitrite, UA: NEGATIVE
Protein Ur, POC: NEGATIVE mg/dL
Spec Grav, UA: 1.02 (ref 1.010–1.025)
Urobilinogen, UA: 0.2 E.U./dL
pH, UA: 7 (ref 5.0–8.0)

## 2021-12-22 LAB — POCT URINE PREGNANCY: Preg Test, Ur: NEGATIVE

## 2021-12-22 MED ORDER — FLUCONAZOLE 150 MG PO TABS: ORAL_TABLET | ORAL | 0 refills | Status: DC

## 2021-12-22 MED ORDER — TERCONAZOLE 0.8 % VA CREA
TOPICAL_CREAM | VAGINAL | 0 refills | Status: DC
Start: 2021-12-22 — End: 2022-04-06

## 2021-12-22 NOTE — Discharge Instructions (Signed)
Based on the history that you provided to me today, you were treated empirically for vaginal yeast infection with fluconazole (Diflucan), take the first tablet today and take the second tablet three days after the first tablet.  Please abstain from sexual intercourse, tampon use while being treated.   Based on the history that you provided to me today, you are also provided with an external yeast infection cream that you can apply 3-4 times daily for relief of vaginal itching and burning while Diflucan is taking full effect.   Your urine pregnancy test today is negative.   The results of your STD testing today will be made available to you once they are complete, this typically takes 3 to 5 days.  They will initially be posted to your MyChart and, if any of your results are abnormal, you will receive a phone call with those results along with further instructions regarding any further treatment, if needed.    If you have not had complete resolution of your symptoms after completing treatment, please return for repeat evaluation.   Thank you for visiting urgent care today.  I appreciate the opportunity to participate in your care.

## 2021-12-22 NOTE — ED Provider Notes (Signed)
[facility]    CSN: [id number] Arrival date & time: 12/22/21  1517    HISTORY   Chief Complaint  Patient presents with   Vaginal Discharge    Entered by patient   HPI Stacy Hayes is a 21 y.o. female. Pt presents with complaints of thick, white vaginal discharge, itching, vulvar irritation, burning sensation during intercourse x 2 weeks.  Patient states she recently finished a 7-day course of Bactrim for skin infection.  Patient reports reaching out to the same provider that gave her the antibiotic for treatment of yeast infection, was provided with a single dose of Diflucan but states that this provided no relief.   The history is provided by the patient.   Past Medical History:  Diagnosis Date   Asthma    Depression    Patient Active Problem List   Diagnosis Date Noted   Anxiety and depression 11/04/2021   Bulimia nervosa 11/04/2021   History reviewed. No pertinent surgical history. OB History   No obstetric history on file.    Home Medications    Prior to Admission medications   Medication Sig Start Date End Date Taking? Authorizing Provider  albuterol (PROAIR HFA) 108 (90 Base) MCG/ACT inhaler Inhale 2 puffs into the lungs every 6 (six) hours as needed for wheezing or shortness of breath. 10/21/21   Allwardt, Crist Infante, PA-C   Family History Family History  Problem Relation Age of Onset   Hypothyroidism Mother    Alcohol abuse Mother    Depression Mother    Diabetes Mother    Drug abuse Mother    Obesity Mother    Drug abuse Father    Asthma Sister    Depression Sister    Obesity Sister    ADD / ADHD Brother    Asthma Brother    Depression Brother    Learning disabilities Brother    Drug abuse Maternal Grandfather    Cancer Paternal Grandmother    Diabetes Paternal Grandmother    Obesity Paternal Grandmother    Social History Social History   Tobacco Use   Smoking status: Never   Smokeless tobacco: Never  Vaping Use   Vaping  Use: Every day  Substance Use Topics   Alcohol use: Yes    Comment: socially   Drug use: Never   Allergies   Patient has no known allergies.  Review of Systems Review of Systems Pertinent findings noted in history of present illness.   Physical Exam Triage Vital Signs ED Triage Vitals  Enc Vitals Group     BP 04/30/21 0827 (!) 147/82     Pulse Rate 04/30/21 0827 72     Resp 04/30/21 0827 18     Temp 04/30/21 0827 98.3 F (36.8 C)     Temp Source 04/30/21 0827 Oral     SpO2 04/30/21 0827 98 %     Weight --      Height --      Head Circumference --      Peak Flow --      Pain Score 04/30/21 0826 5     Pain Loc --      Pain Edu? --      Excl. in GC? --   No data found.  Updated Vital Signs BP 107/74 (BP Location: Right Arm)   Pulse 83   Temp 98.8 F (37.1 C) (Oral)   Resp 20   LMP 11/29/2021   SpO2 97%   Physical Exam Vitals and nursing  note reviewed.  Constitutional:      General: She is not in acute distress.    Appearance: Normal appearance. She is not ill-appearing.  HENT:     Head: Normocephalic and atraumatic.  Eyes:     General: Lids are normal.        Right eye: No discharge.        Left eye: No discharge.     Extraocular Movements: Extraocular movements intact.     Conjunctiva/sclera: Conjunctivae normal.     Right eye: Right conjunctiva is not injected.     Left eye: Left conjunctiva is not injected.  Neck:     Trachea: Trachea and phonation normal.  Cardiovascular:     Rate and Rhythm: Normal rate and regular rhythm.     Pulses: Normal pulses.     Heart sounds: Normal heart sounds. No murmur heard.    No friction rub. No gallop.  Pulmonary:     Effort: Pulmonary effort is normal. No accessory muscle usage, prolonged expiration or respiratory distress.     Breath sounds: Normal breath sounds. No stridor, decreased air movement or transmitted upper airway sounds. No decreased breath sounds, wheezing, rhonchi or rales.  Chest:     Chest wall:  No tenderness.  Genitourinary:    Comments: Patient politely declines pelvic exam today, patient provided a vaginal swab for testing. Musculoskeletal:        General: Normal range of motion.     Cervical back: Normal range of motion and neck supple. Normal range of motion.  Lymphadenopathy:     Cervical: No cervical adenopathy.  Skin:    General: Skin is warm and dry.     Findings: No erythema or rash.  Neurological:     General: No focal deficit present.     Mental Status: She is alert and oriented to person, place, and time.  Psychiatric:        Mood and Affect: Mood normal.        Behavior: Behavior normal.     Visual Acuity Right Eye Distance:   Left Eye Distance:   Bilateral Distance:    Right Eye Near:   Left Eye Near:    Bilateral Near:     UC Couse / Diagnostics / Procedures:    EKG  Radiology No results found.  Procedures Procedures (including critical care time)  UC Diagnoses / Final Clinical Impressions(s)   I have reviewed the triage vital signs and the nursing notes.  Pertinent labs & imaging results that were available during my care of the patient were reviewed by me and considered in my medical decision making (see chart for details).    Final diagnoses:  Vulvovaginal candidiasis   Patient was provided with Diflucan 150 mg every 3 days x 2 for empiric treatment of presumed vulvovaginal candidiasis based on the history provided to me today.   Patient was provided with Terconazole cream applied 3-4 x daily as needed for empiric treatment of presumed vulvovaginal irritation based on the history provided to me today.   STD screening was performed, patient advised that the results be posted to their MyChart and if any of the results are positive, they will be notified by phone, further treatment will be provided as indicated based on results of STD screening. Patient was advised to abstain from sexual intercourse until that they receive the results of  their STD testing.  Patient was also advised to use condoms to protect themselves from STD exposure. Urinalysis today  was normal. Urine pregnancy test was negative. Return precautions advised.  Drug allergies reviewed, all questions addressed.     ED Prescriptions     Medication Sig Dispense Auth. Provider   terconazole (TERAZOL 3) 0.8 % vaginal cream Apply to vaginal area as needed for relief of burning and discomfort due to yeast infection. 20 g Lynden Oxford Scales, PA-C   fluconazole (DIFLUCAN) 150 MG tablet Take 1 tablet today.  Take second tablet 3 days later. 2 tablet Lynden Oxford Scales, PA-C      PDMP not reviewed this encounter.  Pending results:  Labs Reviewed  POCT URINALYSIS DIP (MANUAL ENTRY)  POCT URINE PREGNANCY  CERVICOVAGINAL ANCILLARY ONLY    Medications Ordered in UC: Medications - No data to display  Disposition Upon Discharge:  Condition: stable for discharge home  Patient presented with concern for an acute illness with associated systemic symptoms and significant discomfort requiring urgent management. In my opinion, this is a condition that a prudent lay person (someone who possesses an average knowledge of health and medicine) may potentially expect to result in complications if not addressed urgently such as respiratory distress, impairment of bodily function or dysfunction of bodily organs.   As such, the patient has been evaluated and assessed, work-up was performed and treatment was provided in alignment with urgent care protocols and evidence based medicine.  Patient/parent/caregiver has been advised that the patient may require follow up for further testing and/or treatment if the symptoms continue in spite of treatment, as clinically indicated and appropriate.  Routine symptom specific, illness specific and/or disease specific instructions were discussed with the patient and/or caregiver at length.  Prevention strategies for avoiding STD  exposure were also discussed.  The patient will follow up with their current PCP if and as advised. If the patient does not currently have a PCP we will assist them in obtaining one.   The patient may need specialty follow up if the symptoms continue, in spite of conservative treatment and management, for further workup, evaluation, consultation and treatment as clinically indicated and appropriate.  Patient/parent/caregiver verbalized understanding and agreement of plan as discussed.  All questions were addressed during visit.  Please see discharge instructions below for further details of plan.  Discharge Instructions:   Discharge Instructions      Based on the history that you provided to me today, you were treated empirically for vaginal yeast infection with fluconazole (Diflucan), take the first tablet today and take the second tablet three days after the first tablet.  Please abstain from sexual intercourse, tampon use while being treated.   Based on the history that you provided to me today, you are also provided with an external yeast infection cream that you can apply 3-4 times daily for relief of vaginal itching and burning while Diflucan is taking full effect.   Your urine pregnancy test today is negative.   The results of your STD testing today will be made available to you once they are complete, this typically takes 3 to 5 days.  They will initially be posted to your MyChart and, if any of your results are abnormal, you will receive a phone call with those results along with further instructions regarding any further treatment, if needed.    If you have not had complete resolution of your symptoms after completing treatment, please return for repeat evaluation.   Thank you for visiting urgent care today.  I appreciate the opportunity to participate in your care.  This office note has been dictated using Museum/gallery curator.  Unfortunately, and despite  my best efforts, this method of dictation can sometimes lead to occasional typographical or grammatical errors.  I apologize in advance if this occurs.      Lynden Oxford Scales, PA-C 12/22/21 1547

## 2021-12-22 NOTE — ED Triage Notes (Addendum)
Pt presents with complaints of vaginal discharge, itching, burning sensation during intercourse x 2 weeks. Reports taking one dose of diflucan with no relief.

## 2021-12-23 LAB — CERVICOVAGINAL ANCILLARY ONLY
Bacterial Vaginitis (gardnerella): NEGATIVE
Candida Glabrata: NEGATIVE
Candida Vaginitis: NEGATIVE
Chlamydia: NEGATIVE
Comment: NEGATIVE
Comment: NEGATIVE
Comment: NEGATIVE
Comment: NEGATIVE
Comment: NEGATIVE
Comment: NORMAL
Neisseria Gonorrhea: NEGATIVE
Trichomonas: NEGATIVE

## 2021-12-24 ENCOUNTER — Ambulatory Visit
Admission: RE | Admit: 2021-12-24 | Discharge: 2021-12-24 | Disposition: A | Discharge status: Home / Self Care | Payer: BC Managed Care – PPO | Source: Ambulatory Visit | Attending: Urgent Care | Admitting: Urgent Care

## 2021-12-24 VITALS — BP 114/74 | HR 67 | Temp 98.8°F | Resp 18

## 2021-12-24 DIAGNOSIS — N898 Other specified noninflammatory disorders of vagina: Secondary | ICD-10-CM | POA: Diagnosis not present

## 2021-12-24 DIAGNOSIS — M545 Low back pain, unspecified: Secondary | ICD-10-CM | POA: Diagnosis not present

## 2021-12-24 DIAGNOSIS — R112 Nausea with vomiting, unspecified: Secondary | ICD-10-CM | POA: Diagnosis not present

## 2021-12-24 DIAGNOSIS — R070 Pain in throat: Secondary | ICD-10-CM | POA: Insufficient documentation

## 2021-12-24 LAB — POCT URINALYSIS DIP (MANUAL ENTRY)
Bilirubin, UA: NEGATIVE
Blood, UA: NEGATIVE
Glucose, UA: NEGATIVE mg/dL
Ketones, POC UA: NEGATIVE mg/dL
Leukocytes, UA: NEGATIVE
Nitrite, UA: NEGATIVE
Protein Ur, POC: 30 mg/dL — AB
Spec Grav, UA: 1.015 (ref 1.010–1.025)
Urobilinogen, UA: 0.2 E.U./dL
pH, UA: 8.5 — AB (ref 5.0–8.0)

## 2021-12-24 MED ORDER — ONDANSETRON 8 MG PO TBDP: 8.0000 mg | ORAL_TABLET | Freq: Three times a day (TID) | ORAL | 0 refills | Status: DC | PRN

## 2021-12-24 MED ORDER — CETIRIZINE HCL 1 MG/ML PO SOLN
10.0000 mg | Freq: Every day | ORAL | 0 refills | Status: DC
Start: 2021-12-24 — End: 2022-04-06

## 2021-12-24 MED ORDER — PSEUDOEPHEDRINE HCL 15 MG/5ML PO LIQD: 60.0000 mg | Freq: Four times a day (QID) | ORAL | 0 refills | Status: DC | PRN

## 2021-12-24 MED ORDER — IBUPROFEN 100 MG/5ML PO SUSP: 600.0000 mg | Freq: Three times a day (TID) | ORAL | 0 refills | Status: DC | PRN

## 2021-12-27 LAB — CERVICOVAGINAL ANCILLARY ONLY
Bacterial Vaginitis (gardnerella): NEGATIVE
Candida Glabrata: NEGATIVE
Candida Vaginitis: NEGATIVE
Chlamydia: NEGATIVE
Comment: NEGATIVE
Comment: NEGATIVE
Comment: NEGATIVE
Comment: NEGATIVE
Comment: NEGATIVE
Comment: NORMAL
Neisseria Gonorrhea: NEGATIVE
Trichomonas: NEGATIVE

## 2021-12-31 DIAGNOSIS — F4312 Post-traumatic stress disorder, chronic: Secondary | ICD-10-CM | POA: Diagnosis not present

## 2021-12-31 DIAGNOSIS — F411 Generalized anxiety disorder: Secondary | ICD-10-CM | POA: Diagnosis not present

## 2021-12-31 DIAGNOSIS — F331 Major depressive disorder, recurrent, moderate: Secondary | ICD-10-CM | POA: Diagnosis not present

## 2022-01-03 ENCOUNTER — Other Ambulatory Visit: Payer: Self-pay | Admitting: Physician Assistant

## 2022-01-13 DIAGNOSIS — F331 Major depressive disorder, recurrent, moderate: Secondary | ICD-10-CM | POA: Diagnosis not present

## 2022-01-13 DIAGNOSIS — F4310 Post-traumatic stress disorder, unspecified: Secondary | ICD-10-CM | POA: Diagnosis not present

## 2022-01-13 DIAGNOSIS — F411 Generalized anxiety disorder: Secondary | ICD-10-CM | POA: Diagnosis not present

## 2022-01-27 DIAGNOSIS — F331 Major depressive disorder, recurrent, moderate: Secondary | ICD-10-CM | POA: Diagnosis not present

## 2022-01-27 DIAGNOSIS — F4312 Post-traumatic stress disorder, chronic: Secondary | ICD-10-CM | POA: Diagnosis not present

## 2022-01-27 DIAGNOSIS — F411 Generalized anxiety disorder: Secondary | ICD-10-CM | POA: Diagnosis not present

## 2022-01-28 DIAGNOSIS — F5002 Anorexia nervosa, binge eating/purging type: Secondary | ICD-10-CM | POA: Diagnosis not present

## 2022-01-28 DIAGNOSIS — F331 Major depressive disorder, recurrent, moderate: Secondary | ICD-10-CM | POA: Diagnosis not present

## 2022-01-28 DIAGNOSIS — F411 Generalized anxiety disorder: Secondary | ICD-10-CM | POA: Diagnosis not present

## 2022-01-28 DIAGNOSIS — F4312 Post-traumatic stress disorder, chronic: Secondary | ICD-10-CM | POA: Diagnosis not present

## 2022-02-09 DIAGNOSIS — F411 Generalized anxiety disorder: Secondary | ICD-10-CM | POA: Diagnosis not present

## 2022-02-09 DIAGNOSIS — F4310 Post-traumatic stress disorder, unspecified: Secondary | ICD-10-CM | POA: Diagnosis not present

## 2022-02-09 DIAGNOSIS — F331 Major depressive disorder, recurrent, moderate: Secondary | ICD-10-CM | POA: Diagnosis not present

## 2022-02-09 DIAGNOSIS — F5002 Anorexia nervosa, binge eating/purging type: Secondary | ICD-10-CM | POA: Diagnosis not present

## 2022-02-10 DIAGNOSIS — F4312 Post-traumatic stress disorder, chronic: Secondary | ICD-10-CM | POA: Diagnosis not present

## 2022-02-10 DIAGNOSIS — F603 Borderline personality disorder: Secondary | ICD-10-CM | POA: Diagnosis not present

## 2022-02-10 DIAGNOSIS — F411 Generalized anxiety disorder: Secondary | ICD-10-CM | POA: Diagnosis not present

## 2022-02-10 DIAGNOSIS — F331 Major depressive disorder, recurrent, moderate: Secondary | ICD-10-CM | POA: Diagnosis not present

## 2022-02-15 ENCOUNTER — Telehealth: Payer: BC Managed Care – PPO | Admitting: Physician Assistant

## 2022-02-15 DIAGNOSIS — J019 Acute sinusitis, unspecified: Secondary | ICD-10-CM | POA: Diagnosis not present

## 2022-02-15 NOTE — Progress Notes (Signed)
E-Visit for Sinus Problems  We are sorry that you are not feeling well.  Here is how we plan to help!  Based on what you have shared with me it looks like you have sinusitis.  Sinusitis is inflammation and infection in the sinus cavities of the head.  Based on your presentation I believe you most likely have Acute Viral Sinusitis.This is an infection most likely caused by a virus. There is not specific treatment for viral sinusitis other than to help you with the symptoms until the infection runs its course.  You may use an oral decongestant such as Mucinex D or if you have glaucoma or high blood pressure use plain Mucinex. Saline nasal spray help and can safely be used as often as needed for congestion, I would recommend continuation of steroid nasal sprays such as fluticasone (flonase).   Some authorities believe that zinc sprays or the use of Echinacea may shorten the course of your symptoms.  Sinus infections are not as easily transmitted as other respiratory infection, however we still recommend that you avoid close contact with loved ones, especially the very young and elderly.  Remember to wash your hands thoroughly throughout the day as this is the number one way to prevent the spread of infection!  Home Care: Only take medications as instructed by your medical team. Do not take these medications with alcohol. A steam or ultrasonic humidifier can help congestion.  You can place a towel over your head and breathe in the steam from hot water coming from a faucet. Avoid close contacts especially the very young and the elderly. Cover your mouth when you cough or sneeze. Always remember to wash your hands.  Get Help Right Away If: You develop worsening fever or sinus pain. You develop a severe head ache or visual changes. Your symptoms persist after you have completed your treatment plan.  Make sure you Understand these instructions. Will watch your condition. Will get help right away if  you are not doing well or get worse.   Thank you for choosing an e-visit.  Your e-visit answers were reviewed by a board certified advanced clinical practitioner to complete your personal care plan. Depending upon the condition, your plan could have included both over the counter or prescription medications.  Please review your pharmacy choice. Make sure the pharmacy is open so you can pick up prescription now. If there is a problem, you may contact your provider through [employer] and have the prescription routed to another pharmacy.  Your safety is important to [place]. If you have drug allergies check your prescription carefully.   For the next 24 hours you can use MyChart to ask questions about today's visit, request a non-urgent call back, or ask for a work or school excuse. You will get an email in the next two days asking about your experience. I hope that your e-visit has been valuable and will speed your recovery.  Approximately 5 minutes was spent documenting and reviewing patient's chart.

## 2022-02-24 DIAGNOSIS — F4312 Post-traumatic stress disorder, chronic: Secondary | ICD-10-CM | POA: Diagnosis not present

## 2022-02-24 DIAGNOSIS — F411 Generalized anxiety disorder: Secondary | ICD-10-CM | POA: Diagnosis not present

## 2022-02-24 DIAGNOSIS — F331 Major depressive disorder, recurrent, moderate: Secondary | ICD-10-CM | POA: Diagnosis not present

## 2022-02-25 DIAGNOSIS — F411 Generalized anxiety disorder: Secondary | ICD-10-CM | POA: Diagnosis not present

## 2022-02-25 DIAGNOSIS — F5002 Anorexia nervosa, binge eating/purging type: Secondary | ICD-10-CM | POA: Diagnosis not present

## 2022-02-25 DIAGNOSIS — F4312 Post-traumatic stress disorder, chronic: Secondary | ICD-10-CM | POA: Diagnosis not present

## 2022-02-25 DIAGNOSIS — F331 Major depressive disorder, recurrent, moderate: Secondary | ICD-10-CM | POA: Diagnosis not present

## 2022-02-28 ENCOUNTER — Other Ambulatory Visit: Payer: Self-pay | Admitting: Physician Assistant

## 2022-03-10 DIAGNOSIS — F331 Major depressive disorder, recurrent, moderate: Secondary | ICD-10-CM | POA: Diagnosis not present

## 2022-03-10 DIAGNOSIS — F4312 Post-traumatic stress disorder, chronic: Secondary | ICD-10-CM | POA: Diagnosis not present

## 2022-03-10 DIAGNOSIS — F411 Generalized anxiety disorder: Secondary | ICD-10-CM | POA: Diagnosis not present

## 2022-03-24 DIAGNOSIS — F411 Generalized anxiety disorder: Secondary | ICD-10-CM | POA: Diagnosis not present

## 2022-03-24 DIAGNOSIS — F4312 Post-traumatic stress disorder, chronic: Secondary | ICD-10-CM | POA: Diagnosis not present

## 2022-03-24 DIAGNOSIS — F331 Major depressive disorder, recurrent, moderate: Secondary | ICD-10-CM | POA: Diagnosis not present

## 2022-03-24 DIAGNOSIS — F5002 Anorexia nervosa, binge eating/purging type: Secondary | ICD-10-CM | POA: Diagnosis not present

## 2022-03-25 ENCOUNTER — Other Ambulatory Visit: Payer: Self-pay | Admitting: Physician Assistant

## 2022-03-26 MED ORDER — ALBUTEROL SULFATE HFA 108 (90 BASE) MCG/ACT IN AERS: 2.0000 | INHALATION_SPRAY | Freq: Four times a day (QID) | RESPIRATORY_TRACT | 2 refills | Status: DC | PRN

## 2022-03-28 ENCOUNTER — Encounter: Payer: Self-pay | Admitting: *Deleted

## 2022-04-06 ENCOUNTER — Telehealth: Payer: BC Managed Care – PPO | Admitting: Physician Assistant

## 2022-04-06 DIAGNOSIS — J020 Streptococcal pharyngitis: Secondary | ICD-10-CM

## 2022-04-06 MED ORDER — AMOXICILLIN 500 MG PO CAPS: 500.0000 mg | ORAL_CAPSULE | Freq: Two times a day (BID) | ORAL | 0 refills | Status: AC

## 2022-04-06 NOTE — Progress Notes (Signed)

## 2022-04-07 DIAGNOSIS — F331 Major depressive disorder, recurrent, moderate: Secondary | ICD-10-CM | POA: Diagnosis not present

## 2022-04-07 DIAGNOSIS — F603 Borderline personality disorder: Secondary | ICD-10-CM | POA: Diagnosis not present

## 2022-04-07 DIAGNOSIS — F411 Generalized anxiety disorder: Secondary | ICD-10-CM | POA: Diagnosis not present

## 2022-04-07 DIAGNOSIS — F4312 Post-traumatic stress disorder, chronic: Secondary | ICD-10-CM | POA: Diagnosis not present

## 2022-04-15 ENCOUNTER — Telehealth: Payer: BC Managed Care – PPO | Admitting: Family Medicine

## 2022-04-15 DIAGNOSIS — M545 Low back pain, unspecified: Secondary | ICD-10-CM

## 2022-04-15 MED ORDER — CYCLOBENZAPRINE HCL 10 MG PO TABS: 10.0000 mg | ORAL_TABLET | Freq: Three times a day (TID) | ORAL | 0 refills | Status: DC | PRN

## 2022-04-15 MED ORDER — NAPROXEN 500 MG PO TABS: 500.0000 mg | ORAL_TABLET | Freq: Two times a day (BID) | ORAL | 0 refills | Status: DC

## 2022-04-15 NOTE — Progress Notes (Signed)

## 2022-05-18 ENCOUNTER — Telehealth: Payer: Self-pay | Admitting: Physician Assistant

## 2022-05-18 DIAGNOSIS — J208 Acute bronchitis due to other specified organisms: Secondary | ICD-10-CM

## 2022-05-18 DIAGNOSIS — J019 Acute sinusitis, unspecified: Secondary | ICD-10-CM

## 2022-05-18 DIAGNOSIS — B9689 Other specified bacterial agents as the cause of diseases classified elsewhere: Secondary | ICD-10-CM

## 2022-05-18 MED ORDER — PREDNISONE 20 MG PO TABS: 40.0000 mg | ORAL_TABLET | Freq: Every day | ORAL | 0 refills | Status: DC

## 2022-05-18 MED ORDER — PROMETHAZINE-DM 6.25-15 MG/5ML PO SYRP: 5.0000 mL | ORAL_SOLUTION | Freq: Four times a day (QID) | ORAL | 0 refills | Status: DC | PRN

## 2022-05-18 MED ORDER — AMOXICILLIN-POT CLAVULANATE 875-125 MG PO TABS: 1.0000 | ORAL_TABLET | Freq: Two times a day (BID) | ORAL | 0 refills | Status: DC

## 2022-05-18 MED ORDER — BENZONATATE 100 MG PO CAPS: 100.0000 mg | ORAL_CAPSULE | Freq: Three times a day (TID) | ORAL | 0 refills | Status: DC | PRN

## 2022-05-18 NOTE — Progress Notes (Signed)
Virtual Visit Consent   Stacy Hayes, you are scheduled for a virtual visit with a [facility] provider today. Just as with appointments in the office, your consent must be obtained to participate. Your consent will be active for this visit and any virtual visit you may have with one of our providers in the next 365 days. If you have a MyChart account, a copy of this consent can be sent to you electronically.  As this is a virtual visit, video technology does not allow for your provider to perform a traditional examination. This may limit your provider's ability to fully assess your condition. If your provider identifies any concerns that need to be evaluated in person or the need to arrange testing (such as labs, EKG, etc.), we will make arrangements to do so. Although advances in technology are sophisticated, we cannot ensure that it will always work on either your end or our end. If the connection with a video visit is poor, the visit may have to be switched to a telephone visit. With either a video or telephone visit, we are not always able to ensure that we have a secure connection.  By engaging in this virtual visit, you consent to the provision of healthcare and authorize for your insurance to be billed (if applicable) for the services provided during this visit. Depending on your insurance coverage, you may receive a charge related to this service.  I need to obtain your verbal consent now. Are you willing to proceed with your visit today? Stacy Hayes has provided verbal consent on 05/18/2022 for a virtual visit (video or telephone). [name], PA-C  Date: 05/18/2022 4:44 PM  Virtual Visit via Video Note   I, [name], connected with  Stacy Hayes  ([id number], Sep 12, 2000) on 05/18/22 at  4:45 PM EST by a video-enabled telemedicine application and verified that I am speaking with the correct person using two identifiers.  Location: Patient: Virtual Visit  Location Patient: Home Provider: Virtual Visit Location Provider: Home Office   I discussed the limitations of evaluation and management by telemedicine and the availability of in person appointments. The patient expressed understanding and agreed to proceed.    History of Present Illness: Stacy Hayes is a 21 y.o. who identifies as a female who was assigned female at birth, and is being seen today for URI symptoms.  HPI: URI  This is a new problem. The current episode started in the past 7 days (Symptoms started after traveling from [place]). The problem has been gradually worsening. There has been no fever. Associated symptoms include congestion, coughing, ear pain (bilateral), headaches, a plugged ear sensation, rhinorrhea (post nasal drainage), sinus pain, a sore throat (started with tingling in throat then became sore) and wheezing. Pertinent negatives include no diarrhea, nausea or vomiting. Associated symptoms comments: Body aches,fatigue. She has tried NSAIDs and increased fluids (dayquil, nyquil, tea with ginger, inhaler) for the symptoms. The treatment provided no relief.    Covid testing, flu testing and strep testing has all been negative.   Problems:  Patient Active Problem List   Diagnosis Date Noted   Anxiety and depression 11/04/2021   Bulimia nervosa 11/04/2021    Allergies: No Known Allergies Medications:  Current Outpatient Medications:    amoxicillin-clavulanate (AUGMENTIN) 875-125 MG tablet, Take 1 tablet by mouth 2 (two) times daily., Disp: 14 tablet, Rfl: 0   benzonatate (TESSALON) 100 MG capsule, Take 1 capsule (100 mg total) by mouth 3 (three) times daily as needed.,  Disp: 30 capsule, Rfl: 0   predniSONE (DELTASONE) 20 MG tablet, Take 2 tablets (40 mg total) by mouth daily with breakfast., Disp: 10 tablet, Rfl: 0   promethazine-dextromethorphan (PROMETHAZINE-DM) 6.25-15 MG/5ML syrup, Take 5 mLs by mouth 4 (four) times daily as needed., Disp: 118 mL, Rfl: 0    albuterol (VENTOLIN HFA) 108 (90 Base) MCG/ACT inhaler, Inhale 2 puffs into the lungs every 6 (six) hours as needed for wheezing or shortness of breath., Disp: 8 g, Rfl: 2   cyclobenzaprine (FLEXERIL) 10 MG tablet, Take 1 tablet (10 mg total) by mouth 3 (three) times daily as needed for muscle spasms., Disp: 30 tablet, Rfl: 0   naproxen (NAPROSYN) 500 MG tablet, Take 1 tablet (500 mg total) by mouth 2 (two) times daily with a meal., Disp: 30 tablet, Rfl: 0  Observations/Objective: Patient is well-developed, well-nourished in no acute distress.  Resting comfortably at home.  Head is normocephalic, atraumatic.  No labored breathing.  Speech is clear and coherent with logical content.  Patient is alert and oriented at baseline.    Assessment and Plan: 1. Acute bacterial bronchitis - amoxicillin-clavulanate (AUGMENTIN) 875-125 MG tablet; Take 1 tablet by mouth 2 (two) times daily.  Dispense: 14 tablet; Refill: 0 - predniSONE (DELTASONE) 20 MG tablet; Take 2 tablets (40 mg total) by mouth daily with breakfast.  Dispense: 10 tablet; Refill: 0 - promethazine-dextromethorphan (PROMETHAZINE-DM) 6.25-15 MG/5ML syrup; Take 5 mLs by mouth 4 (four) times daily as needed.  Dispense: 118 mL; Refill: 0 - benzonatate (TESSALON) 100 MG capsule; Take 1 capsule (100 mg total) by mouth 3 (three) times daily as needed.  Dispense: 30 capsule; Refill: 0  2. Acute bacterial sinusitis - amoxicillin-clavulanate (AUGMENTIN) 875-125 MG tablet; Take 1 tablet by mouth 2 (two) times daily.  Dispense: 14 tablet; Refill: 0  - Worsening symptoms that have not responded to OTC medications.  - Will give Augmentin, Prednisone burst (asthma), Tessalon perles and Promethazine DM - Continue inhaler - Steam and humidifier can help - Stay well hydrated and get plenty of rest.  - Seek in person evaluation if no symptom improvement or if symptoms worsen   Follow Up Instructions: I discussed the assessment and treatment plan with  the patient. The patient was provided an opportunity to ask questions and all were answered. The patient agreed with the plan and demonstrated an understanding of the instructions.  A copy of instructions were sent to the patient via MyChart unless otherwise noted below.    The patient was advised to call back or seek an in-person evaluation if the symptoms worsen or if the condition fails to improve as anticipated.  Time:  I spent 10 minutes with the patient via telehealth technology discussing the above problems/concerns.    [name], PA-C

## 2022-05-18 NOTE — Patient Instructions (Signed)
Stacy Hayes, thank you for joining [name], PA-C for today's virtual visit.  While this provider is not your primary care provider (PCP), if your PCP is located in our provider database this encounter information will be shared with them immediately following your visit.   A [facility] MyChart account gives you access to today's visit and all your visits, tests, and labs performed at [facility] " click here if you don't have a [facility] MyChart account or go to [email or web address]  Consent: (Patient) Stacy Hayes provided verbal consent for this virtual visit at the beginning of the encounter.  Current Medications:  Current Outpatient Medications:    amoxicillin-clavulanate (AUGMENTIN) 875-125 MG tablet, Take 1 tablet by mouth 2 (two) times daily., Disp: 14 tablet, Rfl: 0   benzonatate (TESSALON) 100 MG capsule, Take 1 capsule (100 mg total) by mouth 3 (three) times daily as needed., Disp: 30 capsule, Rfl: 0   predniSONE (DELTASONE) 20 MG tablet, Take 2 tablets (40 mg total) by mouth daily with breakfast., Disp: 10 tablet, Rfl: 0   promethazine-dextromethorphan (PROMETHAZINE-DM) 6.25-15 MG/5ML syrup, Take 5 mLs by mouth 4 (four) times daily as needed., Disp: 118 mL, Rfl: 0   albuterol (VENTOLIN HFA) 108 (90 Base) MCG/ACT inhaler, Inhale 2 puffs into the lungs every 6 (six) hours as needed for wheezing or shortness of breath., Disp: 8 g, Rfl: 2   cyclobenzaprine (FLEXERIL) 10 MG tablet, Take 1 tablet (10 mg total) by mouth 3 (three) times daily as needed for muscle spasms., Disp: 30 tablet, Rfl: 0   naproxen (NAPROSYN) 500 MG tablet, Take 1 tablet (500 mg total) by mouth 2 (two) times daily with a meal., Disp: 30 tablet, Rfl: 0   Medications ordered in this encounter:  Meds ordered this encounter  Medications   amoxicillin-clavulanate (AUGMENTIN) 875-125 MG tablet    Sig: Take 1 tablet by mouth 2 (two) times daily.    Dispense:  14 tablet     Refill:  0    Order Specific Question:   Supervising Provider    Answer:   Merrilee Jansky [[id number]]   predniSONE (DELTASONE) 20 MG tablet    Sig: Take 2 tablets (40 mg total) by mouth daily with breakfast.    Dispense:  10 tablet    Refill:  0    Order Specific Question:   Supervising Provider    Answer:   Merrilee Jansky [[id number]]   promethazine-dextromethorphan (PROMETHAZINE-DM) 6.25-15 MG/5ML syrup    Sig: Take 5 mLs by mouth 4 (four) times daily as needed.    Dispense:  118 mL    Refill:  0    Order Specific Question:   Supervising Provider    Answer:   Merrilee Jansky [[id number]]   benzonatate (TESSALON) 100 MG capsule    Sig: Take 1 capsule (100 mg total) by mouth 3 (three) times daily as needed.    Dispense:  30 capsule    Refill:  0    Order Specific Question:   Supervising Provider    Answer:   Merrilee Jansky [id number]     *If you need refills on other medications prior to your next appointment, please contact your pharmacy*  Follow-Up: Call back or seek an in-person evaluation if the symptoms worsen or if the condition fails to improve as anticipated.  [facility] [phone]  Other Instructions  Acute Bronchitis, Adult  Acute bronchitis is when air tubes in the lungs (bronchi) suddenly get swollen.  The condition can make it hard for you to breathe. In adults, acute bronchitis usually goes away within 2 weeks. A cough caused by bronchitis may last up to 3 weeks. Smoking, allergies, and asthma can make the condition worse. What are the causes? Germs that cause cold and flu (viruses). The most common cause of this condition is the virus that causes the common cold. Bacteria. Substances that bother (irritate) the lungs, including: Smoke from cigarettes and other types of tobacco. Dust and pollen. Fumes from chemicals, gases, or burned fuel. Indoor or outdoor air pollution. What increases the risk? A weak body's defense system. This is also  called the immune system. Any condition that affects your lungs and breathing, such as asthma. What are the signs or symptoms? A cough. Coughing up clear, yellow, or green mucus. Making high-pitched whistling sounds when you breathe, most often when you breathe out (wheezing). Runny or stuffy nose. Having too much mucus in your lungs (chest congestion). Shortness of breath. Body aches. A sore throat. How is this treated? Acute bronchitis may go away over time without treatment. Your doctor may tell you to: Drink more fluids. This will help thin your mucus so it is easier to cough up. Use a device that gets medicine into your lungs (inhaler). Use a vaporizer or a humidifier. These are machines that add water to the air. This helps with coughing and poor breathing. Take a medicine that thins mucus and helps clear it from your lungs. Take a medicine that prevents or stops coughing. It is not common to take an antibiotic medicine for this condition. Follow these instructions at home:  Take over-the-counter and prescription medicines only as told by your doctor. Use an inhaler, vaporizer, or humidifier as told by your doctor. Take two teaspoons (10 mL) of honey at bedtime. This helps lessen your coughing at night. Drink enough fluid to keep your pee (urine) pale yellow. Do not smoke or use any products that contain nicotine or tobacco. If you need help quitting, ask your doctor. Get a lot of rest. Return to your normal activities when your doctor says that it is safe. Keep all follow-up visits. How is this prevented?  Wash your hands often with soap and water for at least 20 seconds. If you cannot use soap and water, use hand sanitizer. Avoid contact with people who have cold symptoms. Try not to touch your mouth, nose, or eyes with your hands. Avoid breathing in smoke or chemical fumes. Make sure to get the flu shot every year. Contact a doctor if: Your symptoms do not get better in  2 weeks. You have trouble coughing up the mucus. Your cough keeps you awake at night. You have a fever. Get help right away if: You cough up blood. You have chest pain. You have very bad shortness of breath. You faint or keep feeling like you are going to faint. You have a very bad headache. Your fever or chills get worse. These symptoms may be an emergency. Get help right away. Call your local emergency services (911 in the [place] Do not wait to see if the symptoms will go away. Do not drive yourself to the hospital. Summary Acute bronchitis is when air tubes in the lungs (bronchi) suddenly get swollen. In adults, acute bronchitis usually goes away within 2 weeks. Drink more fluids. This will help thin your mucus so it is easier to cough up. Take over-the-counter and prescription medicines only as told by your doctor. Contact a  doctor if your symptoms do not improve after 2 weeks of treatment. This information is not intended to replace advice given to you by your health care provider. Make sure you discuss any questions you have with your health care provider. Document Revised: 10/21/2020 Document Reviewed: 10/21/2020 Elsevier Patient Education  2023 Elsevier Inc.   Sinus Infection, Adult A sinus infection is soreness and swelling (inflammation) of your sinuses. Sinuses are hollow spaces in the bones around your face. They are located: Around your eyes. In the middle of your forehead. Behind your nose. In your cheekbones. Your sinuses and nasal passages are lined with a fluid called mucus. Mucus drains out of your sinuses. Swelling can trap mucus in your sinuses. This lets germs (bacteria, virus, or fungus) grow, which leads to infection. Most of the time, this condition is caused by a virus. What are the causes? Allergies. Asthma. Germs. Things that block your nose or sinuses. Growths in the nose (nasal polyps). Chemicals or irritants in the air. A fungus. This is  rare. What increases the risk? Having a weak body defense system (immune system). Doing a lot of swimming or diving. Using nasal sprays too much. Smoking. What are the signs or symptoms? The main symptoms of this condition are pain and a feeling of pressure around the sinuses. Other symptoms include: Stuffy nose (congestion). This may make it hard to breathe through your nose. Runny nose (drainage). Soreness, swelling, and warmth in the sinuses. A cough that may get worse at night. Being unable to smell and taste. Mucus that collects in the throat or the back of the nose (postnasal drip). This may cause a sore throat or bad breath. Being very tired (fatigued). A fever. How is this diagnosed? Your symptoms. Your medical history. A physical exam. Tests to find out if your condition is short-term (acute) or long-term (chronic). Your doctor may: Check your nose for growths (polyps). Check your sinuses using a tool that has a light on one end (endoscope). Check for allergies or germs. Do imaging tests, such as an MRI or CT scan. How is this treated? Treatment for this condition depends on the cause and whether it is short-term or long-term. If caused by a virus, your symptoms should go away on their own within 10 days. You may be given medicines to relieve symptoms. They include: Medicines that shrink swollen tissue in the nose. A spray that treats swelling of the nostrils. Rinses that help get rid of thick mucus in your nose (nasal saline washes). Medicines that treat allergies (antihistamines). Over-the-counter pain relievers. If caused by bacteria, your doctor may wait to see if you will get better without treatment. You may be given antibiotic medicine if you have: A very bad infection. A weak body defense system. If caused by growths in the nose, surgery may be needed. Follow these instructions at home: Medicines Take, use, or apply over-the-counter and prescription medicines  only as told by your doctor. These may include nasal sprays. If you were prescribed an antibiotic medicine, take it as told by your doctor. Do not stop taking it even if you start to feel better. Hydrate and humidify  Drink enough water to keep your pee (urine) pale yellow. Use a cool mist humidifier to keep the humidity level in your home above 50%. Breathe in steam for 10-15 minutes, 3-4 times a day, or as told by your doctor. You can do this in the bathroom while a hot shower is running. Try not to spend  time in cool or dry air. Rest Rest as much as you can. Sleep with your head raised (elevated). Make sure you get enough sleep each night. General instructions  Put a warm, moist washcloth on your face 3-4 times a day, or as often as told by your doctor. Use nasal saline washes as often as told by your doctor. Wash your hands often with soap and water. If you cannot use soap and water, use hand sanitizer. Do not smoke. Avoid being around people who are smoking (secondhand smoke). Keep all follow-up visits. Contact a doctor if: You have a fever. Your symptoms get worse. Your symptoms do not get better within 10 days. Get help right away if: You have a very bad headache. You cannot stop vomiting. You have very bad pain or swelling around your face or eyes. You have trouble seeing. You feel confused. Your neck is stiff. You have trouble breathing. These symptoms may be an emergency. Get help right away. Call 911. Do not wait to see if the symptoms will go away. Do not drive yourself to the hospital. Summary A sinus infection is swelling of your sinuses. Sinuses are hollow spaces in the bones around your face. This condition is caused by tissues in your nose that become inflamed or swollen. This traps germs. These can lead to infection. If you were prescribed an antibiotic medicine, take it as told by your doctor. Do not stop taking it even if you start to feel better. Keep all  follow-up visits. This information is not intended to replace advice given to you by your health care provider. Make sure you discuss any questions you have with your health care provider. Document Revised: 05/25/2021 Document Reviewed: 05/25/2021 Elsevier Patient Education  2023 Elsevier Inc.    If you have been instructed to have an in-person evaluation today at a local Urgent Care facility, please use the link below. It will take you to a list of all of our available [facility], including address, phone number and hours of operation. Please do not delay care.  [facility]  If you or a family member do not have a primary care provider, use the link below to schedule a visit and establish care. When you choose a [facility] primary care physician or advanced practice provider, you gain a long-term partner in health. Find a Primary Care Provider  Learn more about [facility] in-office and virtual care options: [facility] - Get Care Now

## 2022-06-04 ENCOUNTER — Other Ambulatory Visit: Payer: Self-pay | Admitting: Physician Assistant

## 2022-06-05 IMAGING — DX DG CHEST 2V
2 series · 2 of 2 positions shown · non-contrast
Comparison: None.

CLINICAL DATA: Cough, shortness of breath

EXAM:
CHEST - 2 VIEW

[chest pa]
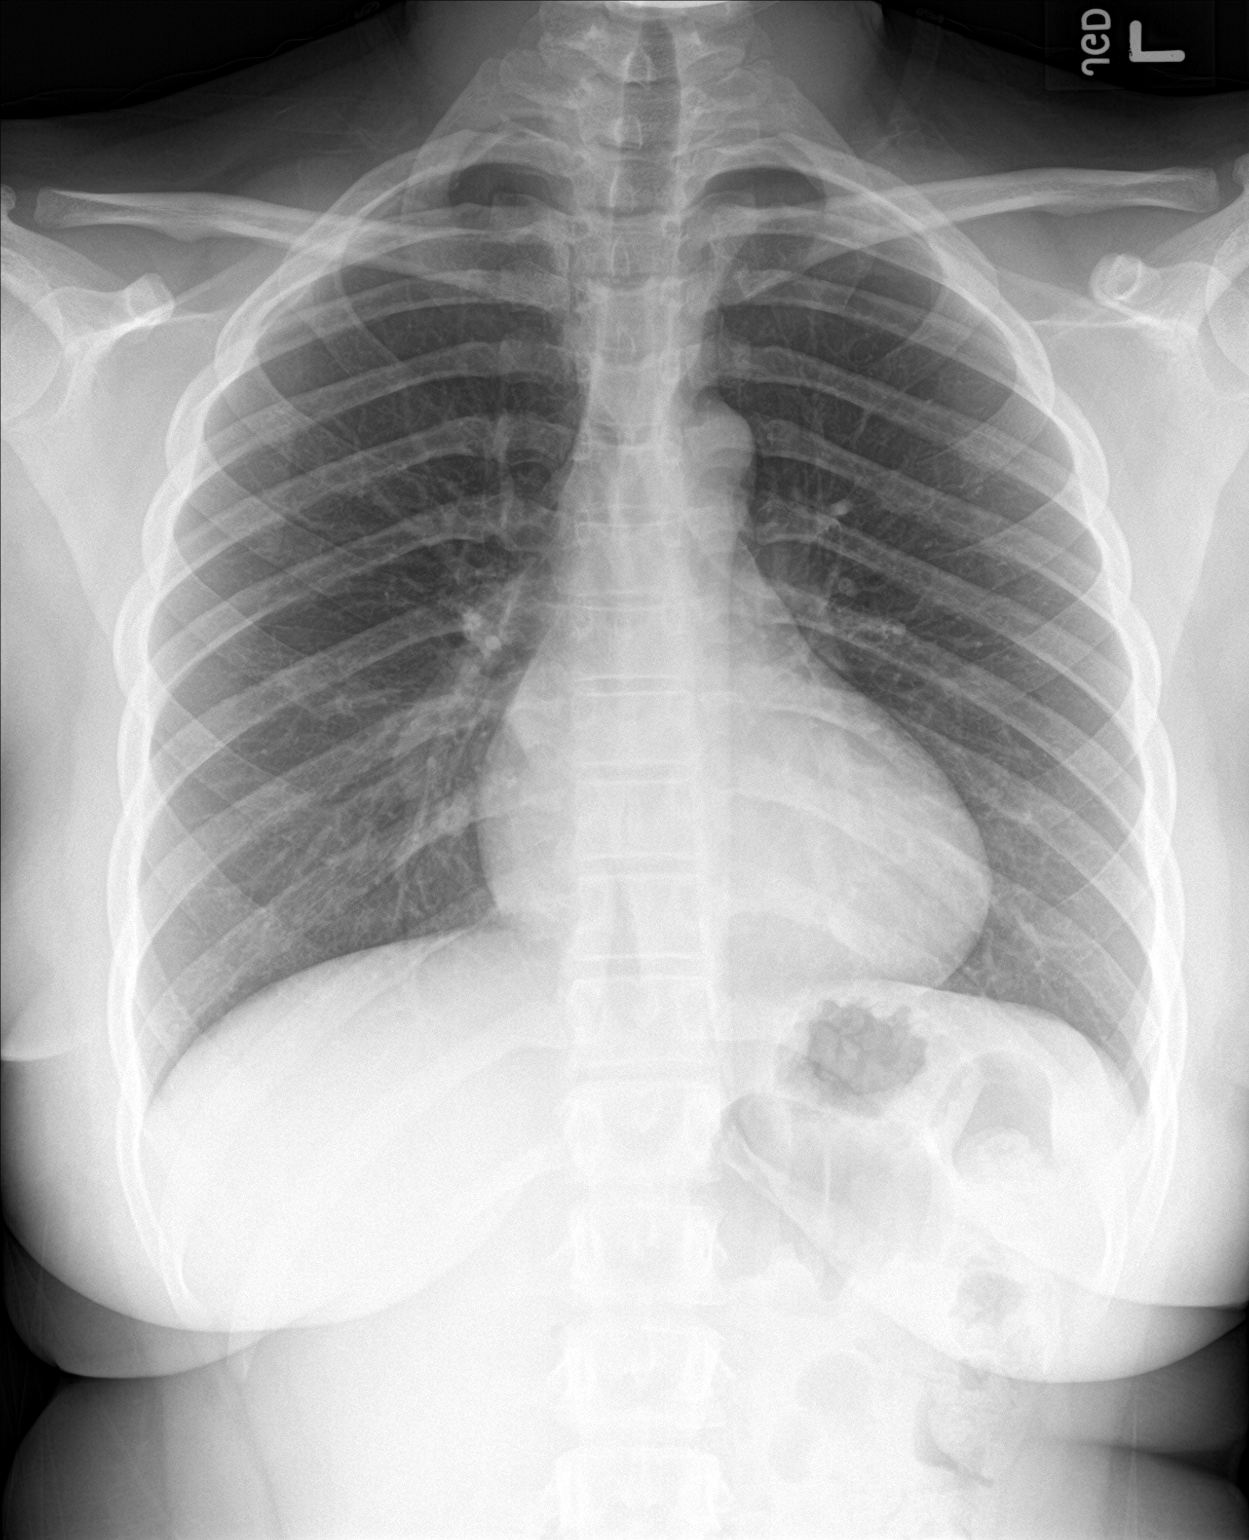

[chest lat]
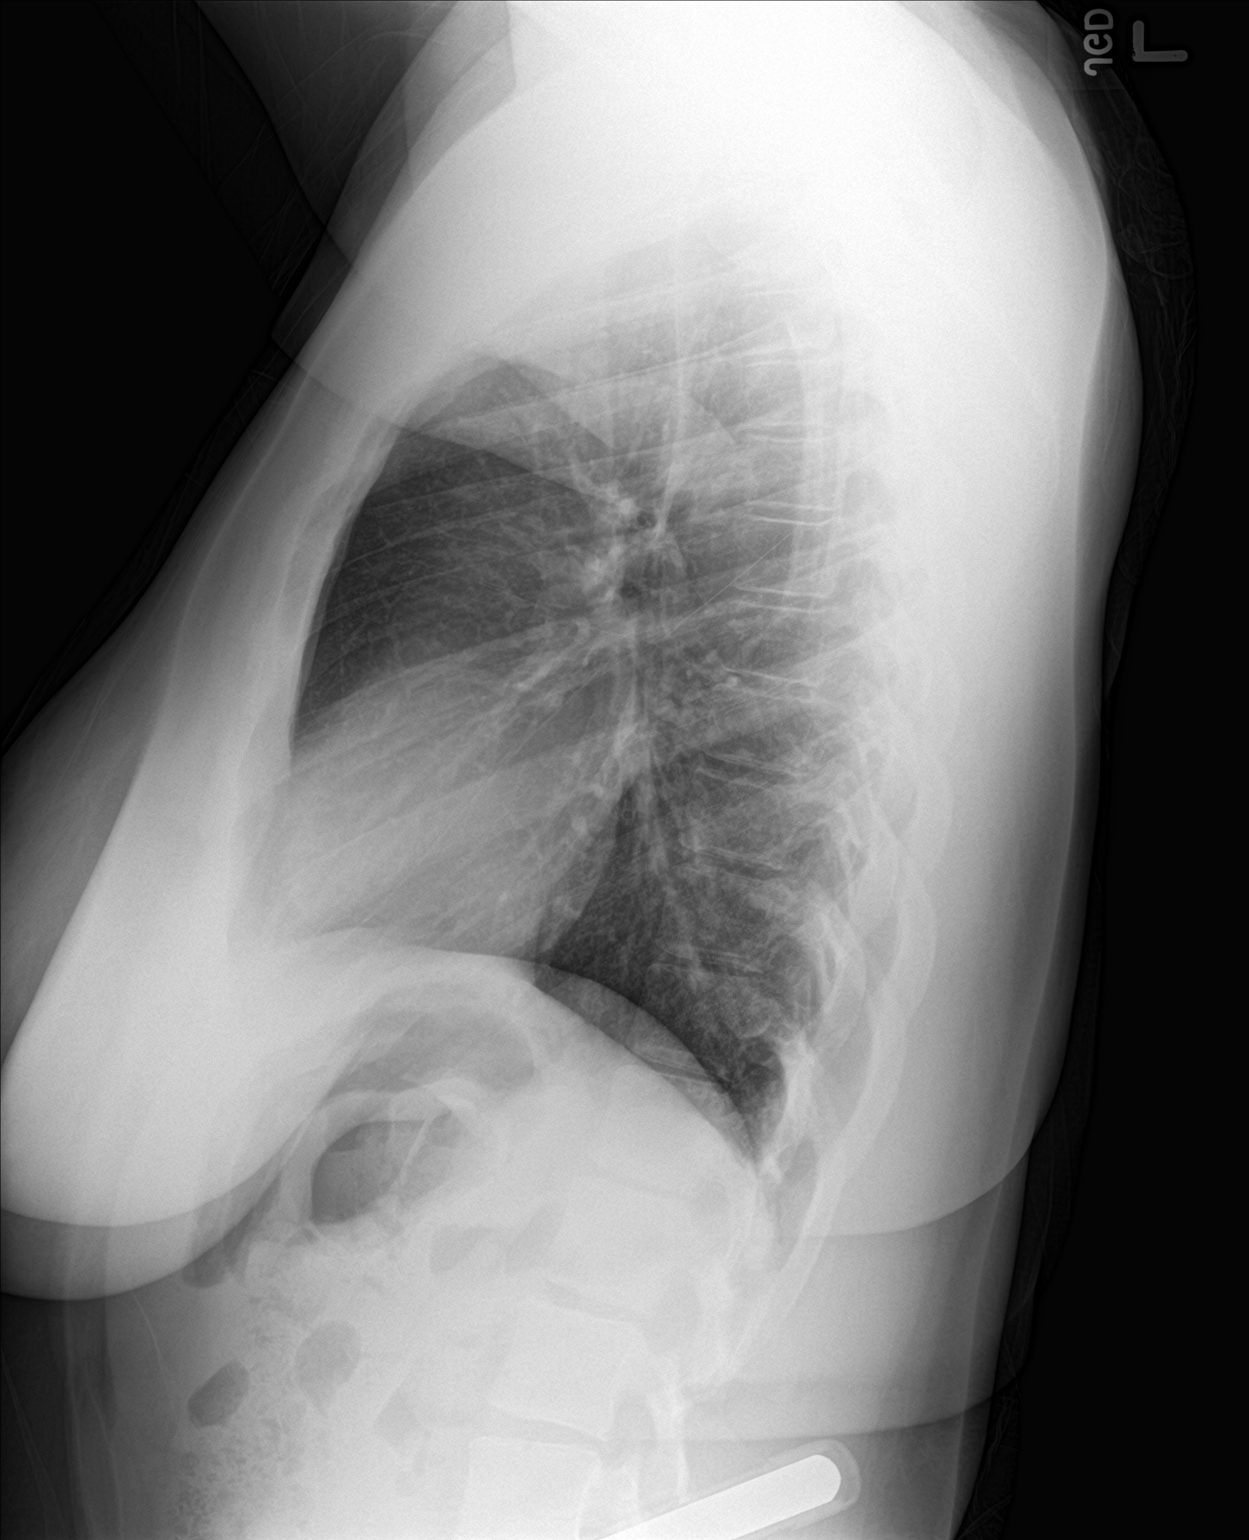

[2 of 2 positions shown; findings below may reference images not displayed]

FINDINGS: The heart size and mediastinal contours are within normal limits.
Both lungs are clear. The visualized skeletal structures are
unremarkable.
IMPRESSION: No active cardiopulmonary disease.

## 2022-06-16 ENCOUNTER — Encounter: Payer: Self-pay | Admitting: *Deleted

## 2022-06-28 ENCOUNTER — Other Ambulatory Visit: Payer: Self-pay | Admitting: Physician Assistant

## 2022-07-14 DIAGNOSIS — F331 Major depressive disorder, recurrent, moderate: Secondary | ICD-10-CM | POA: Diagnosis not present

## 2022-07-14 DIAGNOSIS — F603 Borderline personality disorder: Secondary | ICD-10-CM | POA: Diagnosis not present

## 2022-07-14 DIAGNOSIS — F4312 Post-traumatic stress disorder, chronic: Secondary | ICD-10-CM | POA: Diagnosis not present

## 2022-07-14 DIAGNOSIS — F411 Generalized anxiety disorder: Secondary | ICD-10-CM | POA: Diagnosis not present

## 2022-08-10 DIAGNOSIS — F331 Major depressive disorder, recurrent, moderate: Secondary | ICD-10-CM | POA: Diagnosis not present

## 2022-08-10 DIAGNOSIS — F5002 Anorexia nervosa, binge eating/purging type: Secondary | ICD-10-CM | POA: Diagnosis not present

## 2022-08-10 DIAGNOSIS — F411 Generalized anxiety disorder: Secondary | ICD-10-CM | POA: Diagnosis not present

## 2022-08-10 DIAGNOSIS — F4312 Post-traumatic stress disorder, chronic: Secondary | ICD-10-CM | POA: Diagnosis not present

## 2022-08-11 DIAGNOSIS — F4312 Post-traumatic stress disorder, chronic: Secondary | ICD-10-CM | POA: Diagnosis not present

## 2022-08-11 DIAGNOSIS — F331 Major depressive disorder, recurrent, moderate: Secondary | ICD-10-CM | POA: Diagnosis not present

## 2022-08-11 DIAGNOSIS — F411 Generalized anxiety disorder: Secondary | ICD-10-CM | POA: Diagnosis not present

## 2022-08-17 ENCOUNTER — Telehealth: Payer: Self-pay | Admitting: Family Medicine

## 2022-08-17 DIAGNOSIS — K047 Periapical abscess without sinus: Secondary | ICD-10-CM

## 2022-08-18 MED ORDER — IBUPROFEN 600 MG PO TABS: 600.0000 mg | ORAL_TABLET | Freq: Three times a day (TID) | ORAL | 0 refills | Status: DC | PRN

## 2022-08-18 MED ORDER — CLINDAMYCIN HCL 300 MG PO CAPS: 300.0000 mg | ORAL_CAPSULE | Freq: Three times a day (TID) | ORAL | 0 refills | Status: AC

## 2022-08-18 NOTE — Progress Notes (Signed)

## 2022-08-25 DIAGNOSIS — F331 Major depressive disorder, recurrent, moderate: Secondary | ICD-10-CM | POA: Diagnosis not present

## 2022-08-25 DIAGNOSIS — F5002 Anorexia nervosa, binge eating/purging type: Secondary | ICD-10-CM | POA: Diagnosis not present

## 2022-08-25 DIAGNOSIS — F4312 Post-traumatic stress disorder, chronic: Secondary | ICD-10-CM | POA: Diagnosis not present

## 2022-08-25 DIAGNOSIS — F411 Generalized anxiety disorder: Secondary | ICD-10-CM | POA: Diagnosis not present

## 2022-09-01 DIAGNOSIS — F411 Generalized anxiety disorder: Secondary | ICD-10-CM | POA: Diagnosis not present

## 2022-09-01 DIAGNOSIS — F4312 Post-traumatic stress disorder, chronic: Secondary | ICD-10-CM | POA: Diagnosis not present

## 2022-09-01 DIAGNOSIS — F331 Major depressive disorder, recurrent, moderate: Secondary | ICD-10-CM | POA: Diagnosis not present

## 2022-09-01 DIAGNOSIS — F5002 Anorexia nervosa, binge eating/purging type: Secondary | ICD-10-CM | POA: Diagnosis not present

## 2022-09-07 DIAGNOSIS — F5002 Anorexia nervosa, binge eating/purging type: Secondary | ICD-10-CM | POA: Diagnosis not present

## 2022-09-07 DIAGNOSIS — F331 Major depressive disorder, recurrent, moderate: Secondary | ICD-10-CM | POA: Diagnosis not present

## 2022-09-07 DIAGNOSIS — F4312 Post-traumatic stress disorder, chronic: Secondary | ICD-10-CM | POA: Diagnosis not present

## 2022-09-07 DIAGNOSIS — F603 Borderline personality disorder: Secondary | ICD-10-CM | POA: Diagnosis not present

## 2022-09-08 DIAGNOSIS — F902 Attention-deficit hyperactivity disorder, combined type: Secondary | ICD-10-CM | POA: Diagnosis not present

## 2022-09-08 DIAGNOSIS — F331 Major depressive disorder, recurrent, moderate: Secondary | ICD-10-CM | POA: Diagnosis not present

## 2022-09-08 DIAGNOSIS — F4312 Post-traumatic stress disorder, chronic: Secondary | ICD-10-CM | POA: Diagnosis not present

## 2022-10-06 DIAGNOSIS — F331 Major depressive disorder, recurrent, moderate: Secondary | ICD-10-CM | POA: Diagnosis not present

## 2022-10-06 DIAGNOSIS — F603 Borderline personality disorder: Secondary | ICD-10-CM | POA: Diagnosis not present

## 2022-10-06 DIAGNOSIS — F902 Attention-deficit hyperactivity disorder, combined type: Secondary | ICD-10-CM | POA: Diagnosis not present

## 2022-10-06 DIAGNOSIS — F4312 Post-traumatic stress disorder, chronic: Secondary | ICD-10-CM | POA: Diagnosis not present

## 2022-10-20 ENCOUNTER — Telehealth: Payer: Self-pay | Admitting: Physician Assistant

## 2022-10-20 DIAGNOSIS — L700 Acne vulgaris: Secondary | ICD-10-CM

## 2022-10-20 MED ORDER — CLINDAMYCIN PHOS-BENZOYL PEROX 1-5 % EX GEL: Freq: Two times a day (BID) | CUTANEOUS | 0 refills | Status: DC

## 2022-10-20 NOTE — Progress Notes (Signed)
E-Visit for Acne  We are sorry that you are experiencing this issue.  Here is how we plan to help!  Based on what you shared with me it looks like you have cystic acne.  Acne is a disorder of the hair follicles and oil glands (sebaceous glands). The sebaceous glands secrete oils to keep the skin moist.  When the glands get clogged, it can lead to pimples or cysts.  These cysts may become infected and leave scars. Acne is very common and normally occurs at puberty.  Acne is also inherited.  Your personal care plan consists of the following recommendations:  I recommend that you use a daily cleanser  You may try a topical exfoliator and salicylic acid scrub.  These scrubs have coarse particles that clear your pores but may also irritate your skin.  I have prescribed a topical gel with an antibiotic:  Clindamycin-benzoyl peroxide gel.  This gel should be applied to the affected areas twice a day.  Be sure to read the package insert to understand potential side effects.  If excessive dryness or peeling occurs, reduce dose frequency or concentration of the topical scrubs.  If excessive stinging or burning occurs, remove the topical gel with mild soap and water and resume at a lower dose the next day.  Remember oral antibiotics and topical acne treatments may increase your sensitivity to the sun!  HOME CARE: Do not squeeze pimples because that can often lead to infections, worse acne, and scars. Use a moisturizer that contains retinoid or fruit acids that may inhibit the development of new acne lesions. Although there is not a clear link that foods can cause acne, doctors do believe that too many sweets predispose you to skin problems.  GET HELP RIGHT AWAY IF: If your acne gets worse or is not better within 10 days. If you become depressed. If you become pregnant, discontinue medications and call your OB/GYN.  MAKE SURE YOU: Understand these instructions. Will watch your condition. Will get  help right away if you are not doing well or get worse.  Thank you for choosing an e-visit.  Your e-visit answers were reviewed by a board certified advanced clinical practitioner to complete your personal care plan. Depending upon the condition, your plan could have included both over the counter or prescription medications.  Please review your pharmacy choice. Make sure the pharmacy is open so you can pick up prescription now. If there is a problem, you may contact your provider through [employer] and have the prescription routed to another pharmacy.  Your safety is important to [place]. If you have drug allergies check your prescription carefully.   For the next 24 hours you can use MyChart to ask questions about today's visit, request a non-urgent call back, or ask for a work or school excuse. You will get an email in the next two days asking about your experience. I hope that your e-visit has been valuable and will speed your recovery.  I have spent 5 minutes in review of e-visit questionnaire, review and updating patient chart, medical decision making and response to patient.   [name], PA-C

## 2022-10-29 ENCOUNTER — Other Ambulatory Visit: Payer: Self-pay | Admitting: Physician Assistant

## 2022-11-02 ENCOUNTER — Ambulatory Visit
Admission: RE | Admit: 2022-11-02 | Discharge: 2022-11-02 | Disposition: A | Discharge status: Home / Self Care | Payer: BC Managed Care – PPO | Source: Ambulatory Visit | Attending: Urgent Care | Admitting: Urgent Care

## 2022-11-02 VITALS — BP 122/85 | HR 91 | Temp 99.4°F | Resp 16

## 2022-11-02 DIAGNOSIS — Z113 Encounter for screening for infections with a predominantly sexual mode of transmission: Secondary | ICD-10-CM | POA: Insufficient documentation

## 2022-11-02 NOTE — ED Provider Notes (Signed)
[facility]  Note:  This document was prepared using Conservation officer, historic buildings and may include unintentional dictation errors.  MRN: [id number] DOB: 2000-11-17  Subjective:   Arriyanna Mersch is a 22 y.o. female presenting for STI screen. Denies fever, n/v, abdominal pain, pelvic pain, rashes, dysuria, urinary frequency, hematuria, vaginal discharge.  No concerns for pregnancy. No concern for an oral or anal STI. No known exposures.   No current facility-administered medications for this encounter.  Current Outpatient Medications:    clindamycin-benzoyl peroxide (BENZACLIN) gel, Apply topically 2 (two) times daily., Disp: 50 g, Rfl: 0   VENTOLIN HFA 108 (90 Base) MCG/ACT inhaler, INHALE 2 PUFFS BY MOUTH EVERY 6 HOURS AS NEEDED FOR WHEEZING FOR SHORTNESS OF BREATH, Disp: 18 g, Rfl: 0   No Known Allergies  Past Medical History:  Diagnosis Date   Asthma    Depression      No past surgical history on file.  Family History  Problem Relation Age of Onset   Hypothyroidism Mother    Alcohol abuse Mother    Depression Mother    Diabetes Mother    Drug abuse Mother    Obesity Mother    Drug abuse Father    Asthma Sister    Depression Sister    Obesity Sister    ADD / ADHD Brother    Asthma Brother    Depression Brother    Learning disabilities Brother    Drug abuse Maternal Grandfather    Cancer Paternal Grandmother    Diabetes Paternal Grandmother    Obesity Paternal Grandmother     Social History   Tobacco Use   Smoking status: Never   Smokeless tobacco: Never  Vaping Use   Vaping Use: Every day  Substance Use Topics   Alcohol use: Yes    Comment: socially   Drug use: Never    ROS   Objective:   Vitals: BP 122/85 (BP Location: Right Arm)   Pulse 91   Temp 99.4 F (37.4 C) (Oral)   Resp 16   LMP 10/25/2022 (Exact Date)   SpO2 96%   Physical Exam Constitutional:      General: She is not in acute distress.     Appearance: Normal appearance. She is well-developed. She is not ill-appearing, toxic-appearing or diaphoretic.  HENT:     Head: Normocephalic and atraumatic.     Nose: Nose normal.     Mouth/Throat:     Mouth: Mucous membranes are moist.     Pharynx: Oropharynx is clear.  Eyes:     General: No scleral icterus.       Right eye: No discharge.        Left eye: No discharge.     Extraocular Movements: Extraocular movements intact.     Conjunctiva/sclera: Conjunctivae normal.  Cardiovascular:     Rate and Rhythm: Normal rate.  Pulmonary:     Effort: Pulmonary effort is normal.  Abdominal:     General: Bowel sounds are normal. There is no distension.     Palpations: Abdomen is soft. There is no mass.     Tenderness: There is no abdominal tenderness. There is no right CVA tenderness, left CVA tenderness, guarding or rebound.  Skin:    General: Skin is warm and dry.  Neurological:     General: No focal deficit present.     Mental Status: She is alert and oriented to person, place, and time.  Psychiatric:  Mood and Affect: Mood normal.        Behavior: Behavior normal.        Thought Content: Thought content normal.        Judgment: Judgment normal.     Assessment and Plan :   PDMP not reviewed this encounter.  1. Screen for STD (sexually transmitted disease)    Patient is asymptomatic, just wants a routine check up. Will treat based off of lab results.    Wallis Bamberg, [place] 11/02/22 1702

## 2022-11-02 NOTE — ED Triage Notes (Signed)
Pt requested STD's test. Denies any vaginal discharge, itching, dysuria.

## 2022-11-02 NOTE — Discharge Instructions (Addendum)
We will let you know about your test results tomorrow. We will prescribed treatments based off of positive results.

## 2022-11-03 LAB — CERVICOVAGINAL ANCILLARY ONLY
Chlamydia: NEGATIVE
Comment: NEGATIVE
Comment: NEGATIVE
Comment: NORMAL
Neisseria Gonorrhea: NEGATIVE
Trichomonas: NEGATIVE

## 2022-11-03 LAB — HIV ANTIBODY (ROUTINE TESTING W REFLEX): HIV Screen 4th Generation wRfx: NONREACTIVE

## 2022-11-03 LAB — RPR: RPR Ser Ql: NONREACTIVE

## 2022-11-16 ENCOUNTER — Telehealth: Payer: BC Managed Care – PPO | Admitting: Physician Assistant

## 2022-11-16 DIAGNOSIS — J029 Acute pharyngitis, unspecified: Secondary | ICD-10-CM

## 2022-11-16 NOTE — Progress Notes (Signed)
Because you have concern of possible STI, I feel your condition warrants further evaluation and I recommend that you be seen in a face to face visit for testing to make sure you are treated appropriately.    NOTE: There will be NO CHARGE for this eVisit   If you are having a true medical emergency please call 911.      For an urgent face to face visit, [facility] has eight urgent care centers for your convenience:   NEW!! [facility] at [facility] Get Driving Directions [phone] [address] [place], [address]    [facility] Center at [facility] Get Driving Directions [phone] [address] [place], [place] [address]   [facility]) Get Driving Directions [phone] [address] [place], [place] [address]  [facility] Urgent Care Center [facility]) Get Driving Directions [phone] [address] [place],  [place]  [address]  [facility] Center [facility] - at [employer]  [phone] [place],  [place] [address]   [facility] Urgent Care at [facility] Get Driving Directions [phone] [address] [place], [place] [address]   [facility] Health Urgent Care at [facility] Get Driving Directions  [phone] [address] [place], [place] [address]   [facility] Health Urgent Care at [facility] Directions [phone] [address], [address] [place], [place] [address]  Your MyChart E-visit questionnaire answers were reviewed by a board certified advanced clinical practitioner to complete your personal care plan based on your specific symptoms.  Thank you for using e-Visits.   I have spent 5 minutes in review of e-visit questionnaire, review and updating patient chart, medical decision making and response to patient.    [name], PA-C

## 2022-11-17 ENCOUNTER — Ambulatory Visit
Admission: EM | Admit: 2022-11-17 | Discharge: 2022-11-17 | Disposition: A | Discharge status: Home / Self Care | Payer: BC Managed Care – PPO | Attending: Internal Medicine | Admitting: Internal Medicine

## 2022-11-17 DIAGNOSIS — R300 Dysuria: Secondary | ICD-10-CM | POA: Diagnosis not present

## 2022-11-17 DIAGNOSIS — B9689 Other specified bacterial agents as the cause of diseases classified elsewhere: Secondary | ICD-10-CM | POA: Diagnosis not present

## 2022-11-17 DIAGNOSIS — N760 Acute vaginitis: Secondary | ICD-10-CM | POA: Diagnosis not present

## 2022-11-17 LAB — POCT URINALYSIS DIP (MANUAL ENTRY)
Bilirubin, UA: NEGATIVE
Blood, UA: NEGATIVE
Glucose, UA: NEGATIVE mg/dL
Leukocytes, UA: NEGATIVE
Nitrite, UA: NEGATIVE
Protein Ur, POC: 30 mg/dL — AB
Spec Grav, UA: 1.025 (ref 1.010–1.025)
Urobilinogen, UA: 1 E.U./dL
pH, UA: 7 (ref 5.0–8.0)

## 2022-11-17 LAB — POCT URINE PREGNANCY: Preg Test, Ur: NEGATIVE

## 2022-11-17 MED ORDER — FLUCONAZOLE 150 MG PO TABS: 150.0000 mg | ORAL_TABLET | ORAL | 0 refills | Status: DC

## 2022-11-17 MED ORDER — METRONIDAZOLE 500 MG PO TABS: 500.0000 mg | ORAL_TABLET | Freq: Two times a day (BID) | ORAL | 0 refills | Status: DC

## 2022-11-17 NOTE — Discharge Instructions (Addendum)
Make sure you hydrate very well with plain water and a quantity of 64 ounces of water a day.  Please limit drinks that are considered urinary irritants such as soda, sweet tea, coffee, energy drinks, alcohol.  These can worsen your urinary and genital symptoms but also be the source of them.  I will let you know about your vaginal and oral swab and urine culture results through MyChart to see if we need to prescribe or change your antibiotics based off of those results.

## 2022-11-17 NOTE — ED Provider Notes (Signed)
[facility] URGENT [facility]ARE C[facility]  Note:  This document was prepared using Conservation officer, historic buildings and may include unintentional dictation errors.  MRN: [id number] DOB: 01/18/2001  Subjective:   Stacy Hayes is a 22 y.o. female presenting for 2-day history of recurrent vaginal discharge, vaginal irritation and dysuria.  Patient has a history of BV and yeast infections, would like to be covered for this.  Does not drink many fluids including water.  She would like to be checked for STI as she just had sex about a week and a half ago with a new partner and did not use protection.  Would also like to have her throat swab as she had unprotected oral sex.  Denies fever, n/v, abdominal pain, pelvic pain, rashes, urinary frequency, hematuria.    No current facility-administered medications for this encounter.  Current Outpatient Medications:    clindamycin-benzoyl peroxide (BENZACLIN) gel, Apply topically 2 (two) times daily., Disp: 50 g, Rfl: 0   FLUoxetine (PROZAC) 20 MG capsule, Take 20 mg by mouth daily., Disp: , Rfl:    lamoTRIgine (LAMICTAL) 25 MG tablet, Take 25 mg by mouth 2 (two) times daily., Disp: , Rfl:    VENTOLIN HFA 108 (90 Base) MCG/ACT inhaler, INHALE 2 PUFFS BY MOUTH EVERY 6 HOURS AS NEEDED FOR WHEEZING FOR SHORTNESS OF BREATH, Disp: 18 g, Rfl: 0   No Known Allergies  Past Medical History:  Diagnosis Date   Asthma    Depression      History reviewed. No pertinent surgical history.  Family History  Problem Relation Age of Onset   Hypothyroidism Mother    Alcohol abuse Mother    Depression Mother    Diabetes Mother    Drug abuse Mother    Obesity Mother    Drug abuse Father    Asthma Sister    Depression Sister    Obesity Sister    ADD / ADHD Brother    Asthma Brother    Depression Brother    Learning disabilities Brother    Drug abuse Maternal Grandfather    Cancer Paternal Grandmother    Diabetes Paternal Grandmother    Obesity Paternal  Grandmother     Social History   Tobacco Use   Smoking status: Never   Smokeless tobacco: Never  Vaping Use   Vaping Use: Every day  Substance Use Topics   Alcohol use: Yes    Comment: socially   Drug use: Never    ROS   Objective:   Vitals: BP 128/87 (BP Location: Left Arm)   Pulse 94   Temp 99.5 F (37.5 C) (Oral)   Resp 16   LMP 10/25/2022   SpO2 95%   Physical Exam Constitutional:      General: She is not in acute distress.    Appearance: Normal appearance. She is well-developed. She is not ill-appearing, toxic-appearing or diaphoretic.  HENT:     Head: Normocephalic and atraumatic.     Nose: Nose normal.     Mouth/Throat:     Mouth: Mucous membranes are moist.     Pharynx: Oropharynx is clear.  Eyes:     General: No scleral icterus.       Right eye: No discharge.        Left eye: No discharge.     Extraocular Movements: Extraocular movements intact.     Conjunctiva/sclera: Conjunctivae normal.  Cardiovascular:     Rate and Rhythm: Normal rate.  Pulmonary:     Effort: Pulmonary effort  is normal.  Abdominal:     General: Bowel sounds are normal. There is no distension.     Palpations: Abdomen is soft. There is no mass.     Tenderness: There is no abdominal tenderness. There is no right CVA tenderness, left CVA tenderness, guarding or rebound.  Skin:    General: Skin is warm and dry.  Neurological:     General: No focal deficit present.     Mental Status: She is alert and oriented to person, place, and time.  Psychiatric:        Mood and Affect: Mood normal.        Behavior: Behavior normal.        Thought Content: Thought content normal.        Judgment: Judgment normal.     Results for orders placed or performed during the hospital encounter of 11/17/22 (from the past 24 hour(s))  POCT urinalysis dipstick     Status: Abnormal   Collection Time: 11/17/22  7:30 PM  Result Value Ref Range   Color, UA yellow yellow   Clarity, UA clear clear    Glucose, UA negative negative mg/dL   Bilirubin, UA negative negative   Ketones, POC UA moderate (40) (A) negative mg/dL   Spec Grav, UA 5.409 8.119 - 1.025   Blood, UA negative negative   pH, UA 7.0 5.0 - 8.0   Protein Ur, POC =30 (A) negative mg/dL   Urobilinogen, UA 1.0 0.2 or 1.0 E.U./dL   Nitrite, UA Negative Negative   Leukocytes, UA Negative Negative  POCT urine pregnancy     Status: None   Collection Time: 11/17/22  7:31 PM  Result Value Ref Range   Preg Test, Ur Negative Negative    Assessment and Plan :   PDMP not reviewed this encounter.  1. Bacterial vaginosis   2. Dysuria   3. Acute vaginitis    We will treat patient empirically for bacterial vaginosis with Flagyl and for yeast vaginitis with fluconazole.  Labs pending. Counseled patient on potential for adverse effects with medications prescribed/recommended today, ER and return-to-clinic precautions discussed, patient verbalized understanding.    Wallis Bamberg, [place] 11/18/22 [phone]

## 2022-11-17 NOTE — ED Triage Notes (Signed)
Pt c/o dysuria x today and vaginal d/c x 2 days-NAD-steady gait

## 2022-11-18 LAB — CYTOLOGY, (ORAL, ANAL, URETHRAL) ANCILLARY ONLY
Chlamydia: NEGATIVE
Comment: NEGATIVE
Comment: NEGATIVE
Comment: NORMAL
Neisseria Gonorrhea: NEGATIVE
Trichomonas: NEGATIVE

## 2022-11-18 LAB — CERVICOVAGINAL ANCILLARY ONLY
Bacterial Vaginitis (gardnerella): NEGATIVE
Candida Glabrata: NEGATIVE
Candida Vaginitis: POSITIVE — AB
Chlamydia: NEGATIVE
Comment: NEGATIVE
Comment: NEGATIVE
Comment: NEGATIVE
Comment: NEGATIVE
Comment: NEGATIVE
Comment: NORMAL
Neisseria Gonorrhea: NEGATIVE
Trichomonas: NEGATIVE

## 2022-11-19 LAB — URINE CULTURE: Culture: <10000 — AB

## 2022-12-14 ENCOUNTER — Other Ambulatory Visit: Payer: Self-pay | Admitting: Physician Assistant

## 2022-12-29 DIAGNOSIS — N751 Abscess of Bartholin's gland: Secondary | ICD-10-CM | POA: Diagnosis not present

## 2022-12-29 DIAGNOSIS — N946 Dysmenorrhea, unspecified: Secondary | ICD-10-CM | POA: Diagnosis not present

## 2022-12-29 DIAGNOSIS — Z139 Encounter for screening, unspecified: Secondary | ICD-10-CM | POA: Diagnosis not present

## 2022-12-29 DIAGNOSIS — N898 Other specified noninflammatory disorders of vagina: Secondary | ICD-10-CM | POA: Diagnosis not present

## 2023-02-03 ENCOUNTER — Other Ambulatory Visit: Payer: Self-pay | Admitting: Physician Assistant

## 2023-02-07 ENCOUNTER — Other Ambulatory Visit: Payer: Self-pay | Admitting: Physician Assistant

## 2023-02-08 NOTE — Telephone Encounter (Signed)
Medication: Ventolin HFA 108 mcg Directions: Inhale 2 puffs  Last given: 12/14/22 Number refills: 0 Last o/v: 11/24/21 Follow up: 05/03/23 (CPE) Labs:   Tried calling patient to get scheduled a sooner appointment for follow up, left a voice message.

## 2023-02-15 ENCOUNTER — Telehealth: Payer: BC Managed Care – PPO | Admitting: Physician Assistant

## 2023-02-15 ENCOUNTER — Other Ambulatory Visit: Payer: Self-pay | Admitting: Physician Assistant

## 2023-02-15 DIAGNOSIS — J4541 Moderate persistent asthma with (acute) exacerbation: Secondary | ICD-10-CM

## 2023-02-15 MED ORDER — PREDNISONE 20 MG PO TABS
40.0000 mg | ORAL_TABLET | Freq: Every day | ORAL | 0 refills | Status: DC
Start: 2023-02-15 — End: 2023-05-17

## 2023-02-15 MED ORDER — VENTOLIN HFA 108 (90 BASE) MCG/ACT IN AERS
1.0000 | INHALATION_SPRAY | Freq: Four times a day (QID) | RESPIRATORY_TRACT | 0 refills | Status: AC | PRN
Start: 2023-02-15 — End: ?

## 2023-02-15 NOTE — Progress Notes (Signed)

## 2023-02-21 DIAGNOSIS — F339 Major depressive disorder, recurrent, unspecified: Secondary | ICD-10-CM | POA: Diagnosis not present

## 2023-02-21 DIAGNOSIS — F502 Bulimia nervosa: Secondary | ICD-10-CM | POA: Diagnosis not present

## 2023-02-21 DIAGNOSIS — F900 Attention-deficit hyperactivity disorder, predominantly inattentive type: Secondary | ICD-10-CM | POA: Diagnosis not present

## 2023-02-21 DIAGNOSIS — F4310 Post-traumatic stress disorder, unspecified: Secondary | ICD-10-CM | POA: Diagnosis not present

## 2023-03-01 ENCOUNTER — Ambulatory Visit: Payer: BC Managed Care – PPO

## 2023-03-01 ENCOUNTER — Telehealth: Payer: BC Managed Care – PPO | Admitting: Family Medicine

## 2023-03-01 DIAGNOSIS — R0602 Shortness of breath: Secondary | ICD-10-CM

## 2023-03-01 DIAGNOSIS — R058 Other specified cough: Secondary | ICD-10-CM

## 2023-03-01 NOTE — Progress Notes (Signed)
Because you might have pneumonia- you need to be seen in person for an xray and make sure we get you on the best antibiotic for this. Additionally a swab for covid and strep if patches look alarming. I feel your condition warrants further evaluation and I recommend that you be seen in a face to face visit.   NOTE: There will be NO CHARGE for this eVisit   If you are having a true medical emergency please call 911.

## 2023-03-29 ENCOUNTER — Other Ambulatory Visit: Payer: Self-pay | Admitting: Physician Assistant

## 2023-03-29 DIAGNOSIS — J4541 Moderate persistent asthma with (acute) exacerbation: Secondary | ICD-10-CM

## 2023-04-03 DIAGNOSIS — F900 Attention-deficit hyperactivity disorder, predominantly inattentive type: Secondary | ICD-10-CM | POA: Diagnosis not present

## 2023-04-03 DIAGNOSIS — F339 Major depressive disorder, recurrent, unspecified: Secondary | ICD-10-CM | POA: Diagnosis not present

## 2023-04-03 DIAGNOSIS — F502 Bulimia nervosa: Secondary | ICD-10-CM | POA: Diagnosis not present

## 2023-04-03 DIAGNOSIS — F4310 Post-traumatic stress disorder, unspecified: Secondary | ICD-10-CM | POA: Diagnosis not present

## 2023-04-17 ENCOUNTER — Other Ambulatory Visit: Payer: Self-pay

## 2023-04-17 ENCOUNTER — Telehealth: Payer: Self-pay | Admitting: Physician Assistant

## 2023-04-17 DIAGNOSIS — J4541 Moderate persistent asthma with (acute) exacerbation: Secondary | ICD-10-CM

## 2023-04-17 NOTE — Telephone Encounter (Signed)
Triage note from patient to see PCP in 3 days' pt requesting refill rescue inhaler, not seen since 11/04/21, please advise

## 2023-04-17 NOTE — Telephone Encounter (Signed)
Noted and agreed, thank you.

## 2023-04-17 NOTE — Telephone Encounter (Signed)
Advised to see pcp within 3 days. Verbal order placed in provider's box  Patient Name First: Stacy Last: Hayes Gender: Female DOB: 2000/09/26 Age: 22 Y 16 D Return Phone Number: [phone] (Primary), [phone] (Secondary) Address: City/ State/ Zip: [place]  [address] Client Stacy [facility] - Human resources officer [facility] Type Call Who Is Calling Patient / Member / Family / Caregiver Call Type Triage / Clinical Relationship To Patient Self Return Phone Number [phone] (Primary) Chief Complaint ASTHMA ATTACK Reason for Call Symptomatic / Request for Health Information Initial Comment Caller states she has shortness of breath, an asthma attack and needs her rescue inhaler refilled. Sees Stacy Hayes Translation No Nurse Assessment Nurse: Stacy Bowens, RN, Stacy Date/Time Stacy Hayes Time): 04/15/2023 1:42:33 PM Confirm and document reason for call. If symptomatic, describe symptoms. ---Caller states that she has been having attacks and ran out of her rescue inhaler 2 weeks ago. Caller requesting rx for her albuterol inhaler 2 puffs every 4-6hrs as needed for asthma prescribed by Stacy Hayes - sent to pharmacy [facility] [phone] [address]. Caller states she is not short of breath now but is experiencing tightness where she can't get a good deep breath. Does the patient have any new or worsening symptoms? ---Yes Will a triage be completed? ---Yes Related visit to physician within the last 2 weeks? ---No Does the PT have any chronic conditions? (i.e. diabetes, asthma, this includes High risk factors for pregnancy, etc.) ---Yes List chronic conditions. ---asthma Is the patient pregnant or possibly pregnant? (Ask all females between the ages of 79-55) ---No Is this a behavioral health or substance abuse call? ---No Guidelines Guideline Title Affirmed Question Affirmed Notes Nurse  Date/Time (Eastern Time) Asthma Attack [1] Mild wheezing comes and goes AND [2] present > 3 days Belac, RN, Stacy 04/15/2023 1:46:18 PM Disp. Time Stacy Hayes Time) Disposition Final User 04/15/2023 1:40:43 PM Send to Urgent Queue Hayes, Stacy Mast 04/15/2023 1:52:23 PM SEE PCP WITHIN 3 DAYS Yes Belac, RN, Stacy 04/15/2023 1:59:07 PM Pharmacy Call Belac, RN, Stacy Reason: albuterol inhaler called in to pharmacy per medication directives. Final Disposition 04/15/2023 1:52:23 PM SEE PCP WITHIN 3 DAYS Yes Belac, RN, Stacy Caller Disagree/Comply Comply Caller Understands Yes PreDisposition Call Doctor Care Advice Given Per Guideline SEE PCP WITHIN 3 DAYS: * You need to be seen within 2 or 3 days. * PCP VISIT: Call your doctor (or NP/PA) during regular office hours and make an appointment. A clinic or urgent care center are good places to go for care if your doctor's office is closed or you can't get an appointment. NOTE: If office will be open tomorrow, tell caller to call then, not in 3 days. * If caller is out of inhaler or neb solution AND the doctor (or NP/PA) approves calling in refill, do so per department policy. * For MILD ASTHMA SYMPTOMS use your quick-relief inhaler (2 puffs each time) or your nebulizer every 4 hours. Continue the quick-relief asthma medicine until you have not wheezed or coughed for 48 hours. It takes a minimum of 7 days of medicine for lung function to return to normal. CALL BACK IF: * An asthma attack is not better after 2 or 3 quick-relief treatments (such as albuterol by inhaler or nebulizer) 20 minutes apart * Quick-relief asthma medicine (such as albuterol by inhaler or nebulizer) is needed more often than every 4 hours * Mild asthma symptoms not better after 24 hours * Mild wheezing  or other asthma symptoms come and go for more than 3 days * You become worse CARE ADVICE given per Asthma Attack (Adult) guideline. Verbal Orders/Maintenance  Medications Medication Refill Route Dosage Regime Duration Admin Instructions User Name Albuterol Yes Inhaler 2 puffs 4 Hours 2 puffs every 4-6 hours as needed for asthma. No refills. Belac, RN, Stacy Comments User: Stacy Fanti, RN Date/Time Stacy Hayes Time): 04/15/2023 1:58:42 PM albuterol maintenance med called in to pharmacy per medication directives: Nurse may call in refills on maintenance medications: o Volume sufficient until office opens o Refills only called into pharmacy where. Referrals REFERRED TO PCP OFFICE

## 2023-04-17 NOTE — Telephone Encounter (Signed)
Called pt to advise refill and verify correct pharmacy; pt scheduled for CPE 05/03/23. LVM with cb number

## 2023-05-01 DIAGNOSIS — F900 Attention-deficit hyperactivity disorder, predominantly inattentive type: Secondary | ICD-10-CM | POA: Diagnosis not present

## 2023-05-01 DIAGNOSIS — F4310 Post-traumatic stress disorder, unspecified: Secondary | ICD-10-CM | POA: Diagnosis not present

## 2023-05-01 DIAGNOSIS — F339 Major depressive disorder, recurrent, unspecified: Secondary | ICD-10-CM | POA: Diagnosis not present

## 2023-05-03 ENCOUNTER — Encounter: Payer: BC Managed Care – PPO | Admitting: Physician Assistant

## 2023-05-17 ENCOUNTER — Telehealth: Payer: BC Managed Care – PPO | Admitting: Physician Assistant

## 2023-05-17 DIAGNOSIS — J0110 Acute frontal sinusitis, unspecified: Secondary | ICD-10-CM | POA: Diagnosis not present

## 2023-05-17 MED ORDER — IPRATROPIUM BROMIDE 0.03 % NA SOLN
2.0000 | Freq: Two times a day (BID) | NASAL | 0 refills | Status: DC
Start: 2023-05-17 — End: 2023-06-20

## 2023-05-17 MED ORDER — AMOXICILLIN-POT CLAVULANATE 875-125 MG PO TABS
1.0000 | ORAL_TABLET | Freq: Two times a day (BID) | ORAL | 0 refills | Status: DC
Start: 2023-05-17 — End: 2023-06-20

## 2023-05-17 NOTE — Patient Instructions (Signed)
Stacy Hayes, thank you for joining Piedad Climes, PA-C for today's virtual visit.  While this provider is not your primary care provider (PCP), if your PCP is located in our provider database this encounter information will be shared with them immediately following your visit.   A [facility] MyChart account gives you access to today's visit and all your visits, tests, and labs performed at [facility] " click here if you don't have a [facility] MyChart account or go to [email or web address]  Consent: (Patient) Stacy Hayes provided verbal consent for this virtual visit at the beginning of the encounter.  Current Medications:  Current Outpatient Medications:    clindamycin-benzoyl peroxide (BENZACLIN) gel, Apply topically 2 (two) times daily., Disp: 50 g, Rfl: 0   FLUoxetine (PROZAC) 20 MG capsule, Take 20 mg by mouth daily., Disp: , Rfl:    lamoTRIgine (LAMICTAL) 25 MG tablet, Take 25 mg by mouth 2 (two) times daily., Disp: , Rfl:    methylphenidate 27 MG PO CR tablet, Take 27 mg by mouth every morning., Disp: , Rfl:    predniSONE (DELTASONE) 20 MG tablet, Take 2 tablets (40 mg total) by mouth daily with breakfast., Disp: 14 tablet, Rfl: 0   VENTOLIN HFA 108 (90 Base) MCG/ACT inhaler, Inhale 1-2 puffs into the lungs every 6 (six) hours as needed for wheezing or shortness of breath., Disp: 18 g, Rfl: 0   Medications ordered in this encounter:  No orders of the defined types were placed in this encounter.    *If you need refills on other medications prior to your next appointment, please contact your pharmacy*  Follow-Up: Call back or seek an in-person evaluation if the symptoms worsen or if the condition fails to improve as anticipated.  [facility] [phone]  Other Instructions  Sinusitis Sinusitis is redness, soreness, and swelling (inflammation) of the paranasal sinuses. Paranasal sinuses are air pockets within the bones of your  face (beneath the eyes, the middle of the forehead, or above the eyes). In healthy paranasal sinuses, mucus is able to drain out, and air is able to circulate through them by way of your nose. However, when your paranasal sinuses are inflamed, mucus and air can become trapped. This can allow bacteria and other germs to grow and cause infection. Sinusitis can develop quickly and last only a short time (acute) or continue over a long period (chronic). Sinusitis that lasts for more than 12 weeks is considered chronic.  CAUSES  Causes of sinusitis include: Allergies. Structural abnormalities, such as displacement of the cartilage that separates your nostrils (deviated septum), which can decrease the air flow through your nose and sinuses and affect sinus drainage. Functional abnormalities, such as when the small hairs (cilia) that line your sinuses and help remove mucus do not work properly or are not present. SYMPTOMS  Symptoms of acute and chronic sinusitis are the same. The primary symptoms are pain and pressure around the affected sinuses. Other symptoms include: Upper toothache. Earache. Headache. Bad breath. Decreased sense of smell and taste. A cough, which worsens when you are lying flat. Fatigue. Fever. Thick drainage from your nose, which often is green and may contain pus (purulent). Swelling and warmth over the affected sinuses. DIAGNOSIS  Your caregiver will perform a physical exam. During the exam, your caregiver may: Look in your nose for signs of abnormal growths in your nostrils (nasal polyps). Tap over the affected sinus to check for signs of infection. View the inside of your  sinuses (endoscopy) with a special imaging device with a light attached (endoscope), which is inserted into your sinuses. If your caregiver suspects that you have chronic sinusitis, one or more of the following tests may be recommended: Allergy tests. Nasal culture A sample of mucus is taken from your  nose and sent to a lab and screened for bacteria. Nasal cytology A sample of mucus is taken from your nose and examined by your caregiver to determine if your sinusitis is related to an allergy. TREATMENT  Most cases of acute sinusitis are related to a viral infection and will resolve on their own within 10 days. Sometimes medicines are prescribed to help relieve symptoms (pain medicine, decongestants, nasal steroid sprays, or saline sprays).  However, for sinusitis related to a bacterial infection, your caregiver will prescribe antibiotic medicines. These are medicines that will help kill the bacteria causing the infection.  Rarely, sinusitis is caused by a fungal infection. In theses cases, your caregiver will prescribe antifungal medicine. For some cases of chronic sinusitis, surgery is needed. Generally, these are cases in which sinusitis recurs more than 3 times per year, despite other treatments. HOME CARE INSTRUCTIONS  Drink plenty of water. Water helps thin the mucus so your sinuses can drain more easily. Use a humidifier. Inhale steam 3 to 4 times a day (for example, sit in the bathroom with the shower running). Apply a warm, moist washcloth to your face 3 to 4 times a day, or as directed by your caregiver. Use saline nasal sprays to help moisten and clean your sinuses. Take over-the-counter or prescription medicines for pain, discomfort, or fever only as directed by your caregiver. SEEK IMMEDIATE MEDICAL CARE IF: You have increasing pain or severe headaches. You have nausea, vomiting, or drowsiness. You have swelling around your face. You have vision problems. You have a stiff neck. You have difficulty breathing. MAKE SURE YOU:  Understand these instructions. Will watch your condition. Will get help right away if you are not doing well or get worse. Document Released: 06/20/2005 Document Revised: 09/12/2011 Document Reviewed: 07/05/2011 [facility] Patient Information 2014  [place], [place].    If you have been instructed to have an in-person evaluation today at a local Urgent Care facility, please use the link below. It will take you to a list of all of our available [facility], including address, phone number and hours of operation. Please do not delay care.  [facility]  If you or a family member do not have a primary care provider, use the link below to schedule a visit and establish care. When you choose a [facility] primary care physician or advanced practice provider, you gain a long-term partner in health. Find a Primary Care Provider  Learn more about [facility]'s in-office and virtual care options:  - Get Care Now

## 2023-05-17 NOTE — Progress Notes (Signed)
Virtual Visit Consent   Stacy Hayes, you are scheduled for a virtual visit with a [facility] provider today. Just as with appointments in the office, your consent must be obtained to participate. Your consent will be active for this visit and any virtual visit you may have with one of our providers in the next 365 days. If you have a MyChart account, a copy of this consent can be sent to you electronically.  As this is a virtual visit, video technology does not allow for your provider to perform a traditional examination. This may limit your provider's ability to fully assess your condition. If your provider identifies any concerns that need to be evaluated in person or the need to arrange testing (such as labs, EKG, etc.), we will make arrangements to do so. Although advances in technology are sophisticated, we cannot ensure that it will always work on either your end or our end. If the connection with a video visit is poor, the visit may have to be switched to a telephone visit. With either a video or telephone visit, we are not always able to ensure that we have a secure connection.  By engaging in this virtual visit, you consent to the provision of healthcare and authorize for your insurance to be billed (if applicable) for the services provided during this visit. Depending on your insurance coverage, you may receive a charge related to this service.  I need to obtain your verbal consent now. Are you willing to proceed with your visit today? Chelise Budhram has provided verbal consent on 05/17/2023 for a virtual visit (video or telephone). Stacy Hayes, [place]  Date: 05/17/2023 10:58 AM  Virtual Visit via Video Note   I, Stacy Hayes, connected with  Randa Lynn  ([id number], 03-31-2001) on 05/17/23 at 10:45 AM EST by a video-enabled telemedicine application and verified that I am speaking with the correct person using two identifiers.  Location: Patient: Virtual Visit  Location Patient: Home Provider: Virtual Visit Location Provider: Home Office   I discussed the limitations of evaluation and management by telemedicine and the availability of in person appointments. The patient expressed understanding and agreed to proceed.    History of Present Illness: Stacy Hayes is a 22 y.o. who identifies as a female who was assigned female at birth, and is being seen today several days of nasal congestion, sore throat, nasal congestion and cough. Now with frontal sinus pain. Denies fever, chills, aches. Denies chest pain or shortness of breath. Mild chest tightness with her asthma but no increased use of her albuterol.  OTC -- Tylenol Severe Head/Cold  HPI: HPI  Problems:  Patient Active Problem List   Diagnosis Date Noted   Bulimia nervosa 11/04/2021   Major depressive disorder, recurrent, moderate (HCC) 10/12/2021   Generalized anxiety disorder 10/12/2021    Allergies: No Known Allergies Medications:  Current Outpatient Medications:    amoxicillin-clavulanate (AUGMENTIN) 875-125 MG tablet, Take 1 tablet by mouth 2 (two) times daily., Disp: 14 tablet, Rfl: 0   amphetamine-dextroamphetamine (ADDERALL XR) 30 MG 24 hr capsule, Take 30 mg by mouth daily., Disp: , Rfl:    ipratropium (ATROVENT) 0.03 % nasal spray, Place 2 sprays into both nostrils every 12 (twelve) hours., Disp: 30 mL, Rfl: 0   lamoTRIgine (LAMICTAL) 100 MG tablet, Take 100 mg by mouth daily., Disp: , Rfl:    FLUoxetine (PROZAC) 20 MG capsule, Take 20 mg by mouth daily., Disp: , Rfl:    VENTOLIN HFA 108 (90  Base) MCG/ACT inhaler, Inhale 1-2 puffs into the lungs every 6 (six) hours as needed for wheezing or shortness of breath., Disp: 18 g, Rfl: 0  Observations/Objective: Patient is well-developed, well-nourished in no acute distress.  Resting comfortably at home.  Head is normocephalic, atraumatic.  No labored breathing. Speech is clear and coherent with logical content.  Patient is alert  and oriented at baseline.   Assessment and Plan: 1. Acute non-recurrent frontal sinusitis - ipratropium (ATROVENT) 0.03 % nasal spray; Place 2 sprays into both nostrils every 12 (twelve) hours.  Dispense: 30 mL; Refill: 0 - amoxicillin-clavulanate (AUGMENTIN) 875-125 MG tablet; Take 1 tablet by mouth 2 (two) times daily.  Dispense: 14 tablet; Refill: 0  Rx Atrovent nasal spray.  Increase fluids.  Rest.  Saline nasal spray.  Probiotic.  Mucinex as directed.  Humidifier in bedroom. Stop the OTC medications, If not turning the corner within 48 hours with these measures, she is to start the Augmentin, taking as directed.  Call or return to clinic if symptoms are not improving.   Follow Up Instructions: I discussed the assessment and treatment plan with the patient. The patient was provided an opportunity to ask questions and all were answered. The patient agreed with the plan and demonstrated an understanding of the instructions.  A copy of instructions were sent to the patient via MyChart unless otherwise noted below.   The patient was advised to call back or seek an in-person evaluation if the symptoms worsen or if the condition fails to improve as anticipated.    Stacy Climes, PA-C

## 2023-05-26 ENCOUNTER — Telehealth: Payer: BC Managed Care – PPO | Admitting: Family Medicine

## 2023-05-26 DIAGNOSIS — J208 Acute bronchitis due to other specified organisms: Secondary | ICD-10-CM | POA: Diagnosis not present

## 2023-05-26 DIAGNOSIS — B9689 Other specified bacterial agents as the cause of diseases classified elsewhere: Secondary | ICD-10-CM

## 2023-05-26 MED ORDER — AZITHROMYCIN 250 MG PO TABS: ORAL_TABLET | ORAL | 0 refills | Status: AC

## 2023-05-26 MED ORDER — BENZONATATE 200 MG PO CAPS: 200.0000 mg | ORAL_CAPSULE | Freq: Two times a day (BID) | ORAL | 0 refills | Status: DC | PRN

## 2023-05-26 NOTE — Progress Notes (Signed)

## 2023-05-29 DIAGNOSIS — F339 Major depressive disorder, recurrent, unspecified: Secondary | ICD-10-CM | POA: Diagnosis not present

## 2023-05-29 DIAGNOSIS — F4310 Post-traumatic stress disorder, unspecified: Secondary | ICD-10-CM | POA: Diagnosis not present

## 2023-05-29 DIAGNOSIS — F900 Attention-deficit hyperactivity disorder, predominantly inattentive type: Secondary | ICD-10-CM | POA: Diagnosis not present

## 2023-06-02 ENCOUNTER — Telehealth: Payer: BC Managed Care – PPO

## 2023-06-02 DIAGNOSIS — Z20828 Contact with and (suspected) exposure to other viral communicable diseases: Secondary | ICD-10-CM | POA: Diagnosis not present

## 2023-06-10 ENCOUNTER — Telehealth: Payer: BC Managed Care – PPO

## 2023-06-10 DIAGNOSIS — Z202 Contact with and (suspected) exposure to infections with a predominantly sexual mode of transmission: Secondary | ICD-10-CM

## 2023-06-11 NOTE — Progress Notes (Signed)
Because Ms Bekker, I feel your condition warrants further evaluation and I recommend that you be seen in a face to face visit.   NOTE: There will be NO CHARGE for this eVisit   If you are having a true medical emergency please call 911.      For an urgent face to face visit, [facility] has eight urgent care centers for your convenience:   NEW!! [facility] at [facility] Get Driving Directions [phone] [address], [address] [place], [address]    [facility] at [facility] Get Driving Directions [phone] [address] [place], [place] [address]   [facility]) Get Driving Directions [phone] [address] [place], [place] [address]  [facility] Center [facility]) Get Driving Directions [phone] [address] [place],  [place]  [address]  [facility]gent Care Center [facility] - at [employer]  [phone] [address] [place],  [place] [address]   [facility] Health Urgent Care at [facility] Get Driving Directions [phone] [address] [place], [place] [address]   [facility] Health Urgent Care at [facility] Get Driving Directions  [phone] [address] [place], [place] [address]   [facility] Urgent Care at [facility] Directions [phone] [address] [place], [place] [address]  Your MyChart E-visit questionnaire answers were reviewed by a board certified advanced clinical practitioner to complete your personal care plan based on your specific symptoms.  Thank you for using e-Visits.      have provided 5 minutes of non face to face time during this encounter for chart review and documentation.

## 2023-06-20 ENCOUNTER — Telehealth: Payer: BC Managed Care – PPO | Admitting: Physician Assistant

## 2023-06-20 DIAGNOSIS — J4521 Mild intermittent asthma with (acute) exacerbation: Secondary | ICD-10-CM

## 2023-06-20 DIAGNOSIS — R6889 Other general symptoms and signs: Secondary | ICD-10-CM

## 2023-06-20 DIAGNOSIS — Z20828 Contact with and (suspected) exposure to other viral communicable diseases: Secondary | ICD-10-CM | POA: Diagnosis not present

## 2023-06-20 MED ORDER — PREDNISONE 20 MG PO TABS
40.0000 mg | ORAL_TABLET | Freq: Every day | ORAL | 0 refills | Status: AC
Start: 2023-06-20 — End: ?

## 2023-06-20 MED ORDER — OSELTAMIVIR PHOSPHATE 75 MG PO CAPS
75.0000 mg | ORAL_CAPSULE | Freq: Two times a day (BID) | ORAL | 0 refills | Status: AC
Start: 2023-06-20 — End: ?

## 2023-06-20 NOTE — Progress Notes (Signed)
I have spent 5 minutes in review of e-visit questionnaire, review and updating patient chart, medical decision making and response to patient.   Mia Milan Cody Jacklynn Dehaas, PA-C    

## 2023-06-20 NOTE — Progress Notes (Signed)
Message sent to patient requesting further input regarding current symptoms. Awaiting patient response.  

## 2023-06-20 NOTE — Progress Notes (Signed)
E visit for Flu like symptoms   We are sorry that you are not feeling well.  Here is how we plan to help! Based on what you have shared with me it looks like you may have a respiratory virus that may be influenza.  Influenza or "the flu" is   an infection caused by a respiratory virus. The flu virus is highly contagious and persons who did not receive their yearly flu vaccination may "catch" the flu from close contact.  We have anti-viral medications to treat the viruses that cause this infection. They are not a "cure" and only shorten the course of the infection. These prescriptions are most effective when they are given within the first 2 days of "flu" symptoms. Antiviral medication are indicated if you have a high risk of complications from the flu. You should  also consider an antiviral medication if you are in close contact with someone who is at risk. These medications can help patients avoid complications from the flu  but have side effects that you should know. Possible side effects from Tamiflu or oseltamivir include nausea, vomiting, diarrhea, dizziness, headaches, eye redness, sleep problems or other respiratory symptoms. You should not take Tamiflu if you have an allergy to oseltamivir or any to the ingredients in Tamiflu.  Based upon your symptoms and potential risk factors I have prescribed Oseltamivir (Tamiflu).  It has been sent to your designated pharmacy.  You will take one 75 mg capsule orally twice a day for the next 5 days. and I recommend that you follow the flu symptoms recommendation that I have listed below.  Please keep well-hydrated and try to get plenty of rest. If you have a humidifier, place it in the bedroom and run it at night. Start a saline nasal rinse for nasal congestion. You can consider use of a nasal steroid spray like Flonase or Nasacort OTC. You can alternate between Tylenol and Ibuprofen if needed for fever, body aches, headache and/or throat pain. Salt  water-gargles and chloraseptic spray can be very beneficial for sore throat. Mucinex-DM for congestion or cough. Continue your rescue inhaler. I have placed a script for prednisone on file at the pharmacy to use as directed, if you note any continued worsening of asthma symptoms. Please take all prescribed medications as directed.  Remain out of work until [employer] for 24 hours without a fever-reducing medication, and you are feeling better.  You should mask until symptoms are resolved.  If anything worsens despite treatment, you need to be evaluated in-person. Please do not delay care.  ANYONE WHO HAS FLU SYMPTOMS SHOULD: Stay home. The flu is highly contagious and going out or to work exposes others! Be sure to drink plenty of fluids. Water is fine as well as fruit juices, sodas and electrolyte beverages. You may want to stay away from caffeine or alcohol. If you are nauseated, try taking small sips of liquids. How do you know if you are getting enough fluid? Your urine should be a pale yellow or almost colorless. Get rest. Taking a steamy shower or using a humidifier may help nasal congestion and ease sore throat pain. Using a saline nasal spray works much the same way. Cough drops, hard candies and sore throat lozenges may ease your cough. Line up a caregiver. Have someone check on you regularly.   GET HELP RIGHT AWAY IF: You cannot keep down liquids or your medications. You become short of breath Your fell like you are going to pass out  or loose consciousness. Your symptoms persist after you have completed your treatment plan MAKE SURE YOU  Understand these instructions. Will watch your condition. Will get help right away if you are not doing well or get worse.  Your e-visit answers were reviewed by a board certified advanced clinical practitioner to complete your personal care plan.  Depending on the condition, your plan could have included both over the counter or prescription  medications.  If there is a problem please reply  once you have received a response from your provider.  Your safety is important to [place].  If you have drug allergies check your prescription carefully.    You can use MyChart to ask questions about today's visit, request a non-urgent call back, or ask for a work or school excuse for 24 hours related to this e-Visit. If it has been greater than 24 hours you will need to follow up with your provider, or enter a new e-Visit to address those concerns.  You will get an e-mail in the next two days asking about your experience.  I hope that your e-visit has been valuable and will speed your recovery. Thank you for using e-visits.

## 2023-06-25 ENCOUNTER — Telehealth: Payer: BC Managed Care – PPO | Admitting: Family Medicine

## 2023-06-25 DIAGNOSIS — J45901 Unspecified asthma with (acute) exacerbation: Secondary | ICD-10-CM

## 2023-06-25 DIAGNOSIS — J4541 Moderate persistent asthma with (acute) exacerbation: Secondary | ICD-10-CM

## 2023-06-25 MED ORDER — ALBUTEROL SULFATE HFA 108 (90 BASE) MCG/ACT IN AERS: 2.0000 | INHALATION_SPRAY | Freq: Four times a day (QID) | RESPIRATORY_TRACT | 0 refills | Status: AC | PRN

## 2023-06-25 NOTE — Progress Notes (Signed)
E-Visit for Asthma  Based on what you have shared with me, it looks like you may have a flare up of your asthma.  Asthma is a chronic (ongoing) lung disease which results in airway obstruction, inflammation and hyper-responsiveness.   Asthma symptoms vary from person to person, with common symptoms including nighttime awakening and decreased ability to participate in normal activities as a result of shortness of breath. It is often triggered by changes in weather, changes in the season, changes in air temperature, or inside (home, school, daycare or work) allergens such as animal dander, mold, mildew, woodstoves or cockroaches.   It can also be triggered by hormonal changes, extreme emotion, physical exertion or an upper respiratory tract illness.     It is important to identify the trigger, and then eliminate or avoid the trigger if possible.   If you have been prescribed medications to be taken on a regular basis, it is important to follow the asthma action plan and to follow guidelines to adjust medication in response to increasing symptoms of decreased peak expiratory flow rate  Treatment: I have prescribed: albuterol  HOME CARE Only take medications as instructed by your medical team. Consider wearing a mask or scarf to improve breathing air temperature have been shown to decrease irritation and decrease exacerbations Get rest. Taking a steamy shower or using a humidifier may help nasal congestion sand ease sore throat pain. You can place a towel over your head and breathe in the steam from hot water coming from a faucet. Using a saline nasal spray works much the same way.  Cough drops, hare candies and sore throat lozenges may ease your cough.  Avoid close contacts especially the very you and the elderly Cover your mouth if you cough or sneeze Always remember to wash your hands.     GET HELP RIGHT AWAY IF: You develop worsening symptoms; breathlessness at rest, drowsy, confused or agitated, unable to speak in full sentences You have coughing fits You develop a severe headache or visual changes You develop shortness of breath, difficulty breathing or start having chest pain Your symptoms persist after you have completed your treatment plan If your symptoms do not improve within 10 days  MAKE SURE YOU Understand these instructions. Will watch your condition. Will get help right away if you are not doing well or get worse.   Your e-visit answers were reviewed by a board certified advanced clinical practitioner to complete your personal care plan, Depending upon the condition, your plan could have included both over the counter or prescription medications.   Please review your pharmacy choice. Your safety is important to [place]. If you have drug allergies check your prescription carefully.  You can use MyChart to ask questions about today's visit, request a non-urgent  call back, or ask for a work or school excuse for 24 hours related to this e-Visit. If it has been greater than 24 hours you will need to follow up with your provider, or enter a new e-Visit to address those concerns.   You will get an e-mail in the next two days asking about your experience. I hope that your e-visit has been valuable and will speed your recovery. Thank you for using e-visits.    have provided 5 minutes of non face to face time during this encounter for chart review and documentation.

## 2023-06-29 ENCOUNTER — Telehealth: Payer: BC Managed Care – PPO | Admitting: Physician Assistant

## 2023-06-29 DIAGNOSIS — N750 Cyst of Bartholin's gland: Secondary | ICD-10-CM

## 2023-06-29 MED ORDER — CEPHALEXIN 500 MG PO CAPS
500.0000 mg | ORAL_CAPSULE | Freq: Four times a day (QID) | ORAL | 0 refills | Status: AC
Start: 2023-06-29 — End: ?

## 2023-06-29 NOTE — Progress Notes (Signed)
E-Visit for Cyst  We are sorry that you are not feeling well. Here is how we plan to help!  Based on what you shared with me it looks like you have an infected cyst  I have prescribed:  Keflex 500mg  take one by mouth four times a day for 5 days  HOME CARE:  Take your medications as ordered and take all of them, even if the skin irritation appears to be healing.   GET HELP RIGHT AWAY IF:  Symptoms that don't begin to go away within 48 hours. Severe redness persists or worsens If the area turns color, spreads or swells. If it blisters and opens, develops yellow-brown crust or bleeds. You develop a fever or chills. If the pain increases or becomes unbearable.  Are unable to keep fluids and food down.  MAKE SURE YOU   Understand these instructions. Will watch your condition. Will get help right away if you are not doing well or get worse.  Thank you for choosing an e-visit.  Your e-visit answers were reviewed by a board certified advanced clinical practitioner to complete your personal care plan. Depending upon the condition, your plan could have included both over the counter or prescription medications.  Please review your pharmacy choice. Make sure the pharmacy is open so you can pick up prescription now. If there is a problem, you may contact your provider through [employer] and have the prescription routed to another pharmacy.  Your safety is important to [place]. If you have drug allergies check your prescription carefully.   For the next 24 hours you can use MyChart to ask questions about today's visit, request a non-urgent call back, or ask for a work or school excuse. You will get an email in the next two days asking about your experience. I hope that your e-visit has been valuable and will speed your recovery.   I have spent 5 minutes in review of e-visit questionnaire, review and updating patient chart, medical decision making and response to patient.   [name], PA-C

## 2023-07-04 DIAGNOSIS — N764 Abscess of vulva: Secondary | ICD-10-CM | POA: Diagnosis not present

## 2023-07-17 DIAGNOSIS — F900 Attention-deficit hyperactivity disorder, predominantly inattentive type: Secondary | ICD-10-CM | POA: Diagnosis not present

## 2023-07-17 DIAGNOSIS — F4310 Post-traumatic stress disorder, unspecified: Secondary | ICD-10-CM | POA: Diagnosis not present

## 2023-07-17 DIAGNOSIS — F339 Major depressive disorder, recurrent, unspecified: Secondary | ICD-10-CM | POA: Diagnosis not present

## 2023-08-01 ENCOUNTER — Encounter: Payer: Self-pay | Admitting: Physician Assistant

## 2023-09-16 ENCOUNTER — Encounter: Admitting: Nurse Practitioner

## 2023-09-16 DIAGNOSIS — J069 Acute upper respiratory infection, unspecified: Secondary | ICD-10-CM

## 2023-09-16 NOTE — Progress Notes (Signed)
 I have spent 5 minutes in review of e-visit questionnaire, review and updating patient chart, medical decision making and response to patient.   Claiborne Rigg, NP

## 2023-09-16 NOTE — Progress Notes (Signed)
  Because you are experiencing generalized symptoms for over a week, I feel your condition warrants further evaluation and I recommend that you be seen in a face-to-face visit. Currently your symptoms are not typical symptoms of Pneumonia.    NOTE: There will be NO CHARGE for this E-Visit   If you are having a true medical emergency, please call 911.     For an urgent face to face visit, [facility] has multiple urgent care centers for your convenience.  Click the link below for the full list of locations and hours, walk-in wait times, appointment scheduling options and driving directions:  Urgent Care - [place], [place], [place], [place], [place], [place]  [place]     Your MyChart E-visit questionnaire answers were reviewed by a board certified advanced clinical practitioner to complete your personal care plan based on your specific symptoms.    Thank you for using e-Visits.

## 2024-08-08 ENCOUNTER — Telehealth: Admitting: Emergency Medicine

## 2024-08-08 DIAGNOSIS — K625 Hemorrhage of anus and rectum: Secondary | ICD-10-CM

## 2024-08-08 NOTE — Progress Notes (Signed)
" °  Because you do not have known hemorrhoids, the bleeding could be from something else, and, I feel your condition warrants further evaluation and I recommend that you be seen in a face-to-face visit.   NOTE: There will be NO CHARGE for this E-Visit   If you are having a true medical emergency, please call 911.     For an urgent face to face visit, [facility] has multiple urgent care centers for your convenience.  Click the link below for the full list of locations and hours, walk-in wait times, appointment scheduling options and driving directions:  Urgent Care - [place], [place], [place], [place], [place], [place]  [place]     Your MyChart E-visit questionnaire answers were reviewed by a board certified advanced clinical practitioner to complete your personal care plan based on your specific symptoms.    Thank you for using e-Visits.    "

## 2024-08-10 ENCOUNTER — Telehealth

## 2024-08-13 ENCOUNTER — Ambulatory Visit: Admitting: Physician Assistant
